# Patient Record
Sex: Male | Born: 1955 | Race: Black or African American | Hispanic: No | Smoking: Current every day smoker
Health system: Southern US, Community
[De-identification: ages and names within clinical notes are randomized; demographics above are authoritative.]

## PROBLEM LIST (undated history)

## (undated) DIAGNOSIS — F209 Schizophrenia, unspecified: Secondary | ICD-10-CM

## (undated) DIAGNOSIS — I10 Essential (primary) hypertension: Secondary | ICD-10-CM

## (undated) DIAGNOSIS — G20A1 Parkinson's disease without dyskinesia, without mention of fluctuations: Secondary | ICD-10-CM

## (undated) DIAGNOSIS — G20 Parkinson's disease: Secondary | ICD-10-CM

## (undated) DIAGNOSIS — E119 Type 2 diabetes mellitus without complications: Secondary | ICD-10-CM

---

## 2018-07-11 ENCOUNTER — Encounter: Payer: Self-pay | Admitting: Emergency Medicine

## 2018-07-11 ENCOUNTER — Inpatient Hospital Stay
Admission: EM | Admit: 2018-07-11 | Discharge: 2018-07-15 | DRG: 684 | Disposition: A | Discharge status: Home / Self Care | Attending: Internal Medicine | Admitting: Internal Medicine

## 2018-07-11 ENCOUNTER — Other Ambulatory Visit: Payer: Self-pay

## 2018-07-11 ENCOUNTER — Emergency Department

## 2018-07-11 DIAGNOSIS — N400 Enlarged prostate without lower urinary tract symptoms: Secondary | ICD-10-CM | POA: Diagnosis present

## 2018-07-11 DIAGNOSIS — Z794 Long term (current) use of insulin: Secondary | ICD-10-CM | POA: Diagnosis not present

## 2018-07-11 DIAGNOSIS — N179 Acute kidney failure, unspecified: Secondary | ICD-10-CM | POA: Diagnosis present

## 2018-07-11 DIAGNOSIS — N320 Bladder-neck obstruction: Secondary | ICD-10-CM | POA: Diagnosis present

## 2018-07-11 DIAGNOSIS — G20 Parkinson's disease: Secondary | ICD-10-CM | POA: Diagnosis present

## 2018-07-11 DIAGNOSIS — I129 Hypertensive chronic kidney disease with stage 1 through stage 4 chronic kidney disease, or unspecified chronic kidney disease: Secondary | ICD-10-CM | POA: Diagnosis present

## 2018-07-11 DIAGNOSIS — F209 Schizophrenia, unspecified: Secondary | ICD-10-CM | POA: Diagnosis present

## 2018-07-11 DIAGNOSIS — N189 Chronic kidney disease, unspecified: Secondary | ICD-10-CM | POA: Diagnosis present

## 2018-07-11 DIAGNOSIS — E1122 Type 2 diabetes mellitus with diabetic chronic kidney disease: Secondary | ICD-10-CM | POA: Diagnosis present

## 2018-07-11 DIAGNOSIS — N136 Pyonephrosis: Secondary | ICD-10-CM | POA: Diagnosis present

## 2018-07-11 DIAGNOSIS — R339 Retention of urine, unspecified: Secondary | ICD-10-CM

## 2018-07-11 DIAGNOSIS — D72829 Elevated white blood cell count, unspecified: Secondary | ICD-10-CM

## 2018-07-11 HISTORY — DX: Type 2 diabetes mellitus without complications: E11.9

## 2018-07-11 HISTORY — DX: Parkinson's disease without dyskinesia, without mention of fluctuations: G20.A1

## 2018-07-11 HISTORY — DX: Parkinson's disease: G20

## 2018-07-11 HISTORY — DX: Schizophrenia, unspecified: F20.9

## 2018-07-11 HISTORY — DX: Essential (primary) hypertension: I10

## 2018-07-11 LAB — URINALYSIS, COMPLETE (UACMP) WITH MICROSCOPIC
Bacteria, UA: NONE SEEN
Bilirubin Urine: NEGATIVE
Glucose, UA: NEGATIVE mg/dL
Ketones, ur: NEGATIVE mg/dL
Leukocytes,Ua: NEGATIVE
Nitrite: NEGATIVE
Protein, ur: NEGATIVE mg/dL
Specific Gravity, Urine: 1.008 (ref 1.005–1.030)
Squamous Epithelial / LPF: NONE SEEN (ref 0–5)
pH: 5 (ref 5.0–8.0)

## 2018-07-11 LAB — COMPREHENSIVE METABOLIC PANEL
ALT: 16 U/L (ref 0–44)
AST: 14 U/L — ABNORMAL LOW (ref 15–41)
Albumin: 3.3 g/dL — ABNORMAL LOW (ref 3.5–5.0)
Alkaline Phosphatase: 66 U/L (ref 38–126)
Anion gap: 9 (ref 5–15)
BUN: 80 mg/dL — ABNORMAL HIGH (ref 8–23)
CO2: 27 mmol/L (ref 22–32)
Calcium: 8.5 mg/dL — ABNORMAL LOW (ref 8.9–10.3)
Chloride: 109 mmol/L (ref 98–111)
Creatinine, Ser: 5.27 mg/dL — ABNORMAL HIGH (ref 0.61–1.24)
GFR calc Af Amer: 12 mL/min — ABNORMAL LOW (ref >60)
GFR calc non Af Amer: 11 mL/min — ABNORMAL LOW (ref >60)
Glucose, Bld: 88 mg/dL (ref 70–99)
Potassium: 4.5 mmol/L (ref 3.5–5.1)
Sodium: 145 mmol/L (ref 135–145)
Total Bilirubin: 0.5 mg/dL (ref 0.3–1.2)
Total Protein: 7.6 g/dL (ref 6.5–8.1)

## 2018-07-11 LAB — CBC WITH DIFFERENTIAL/PLATELET
Abs Immature Granulocytes: 0.07 10*3/uL (ref 0.00–0.07)
Basophils Absolute: 0 10*3/uL (ref 0.0–0.1)
Basophils Relative: 0 %
Eosinophils Absolute: 0.1 10*3/uL (ref 0.0–0.5)
Eosinophils Relative: 1 %
HCT: 36.5 % — ABNORMAL LOW (ref 39.0–52.0)
Hemoglobin: 11.3 g/dL — ABNORMAL LOW (ref 13.0–17.0)
Immature Granulocytes: 1 %
Lymphocytes Relative: 17 %
Lymphs Abs: 2.3 10*3/uL (ref 0.7–4.0)
MCH: 26.4 pg (ref 26.0–34.0)
MCHC: 31 g/dL (ref 30.0–36.0)
MCV: 85.3 fL (ref 80.0–100.0)
Monocytes Absolute: 1.3 10*3/uL — ABNORMAL HIGH (ref 0.1–1.0)
Monocytes Relative: 10 %
Neutro Abs: 9.5 10*3/uL — ABNORMAL HIGH (ref 1.7–7.7)
Neutrophils Relative %: 71 %
Platelets: 429 10*3/uL — ABNORMAL HIGH (ref 150–400)
RBC: 4.28 MIL/uL (ref 4.22–5.81)
RDW: 15.7 % — ABNORMAL HIGH (ref 11.5–15.5)
WBC: 13.2 10*3/uL — ABNORMAL HIGH (ref 4.0–10.5)
nRBC: 0 % (ref 0.0–0.2)

## 2018-07-11 LAB — MRSA PCR SCREENING: MRSA by PCR: NEGATIVE

## 2018-07-11 LAB — SODIUM, URINE, RANDOM: Sodium, Ur: 65 mmol/L

## 2018-07-11 LAB — CREATININE, URINE, RANDOM: Creatinine, Urine: 72 mg/dL

## 2018-07-11 LAB — GLUCOSE, CAPILLARY: Glucose-Capillary: 148 mg/dL — ABNORMAL HIGH (ref 70–99)

## 2018-07-11 LAB — HEMOGLOBIN A1C
Hgb A1c MFr Bld: 6.4 % — ABNORMAL HIGH (ref 4.8–5.6)
Mean Plasma Glucose: 136.98 mg/dL

## 2018-07-11 LAB — OSMOLALITY, URINE: Osmolality, Ur: 334 mOsm/kg (ref 300–900)

## 2018-07-11 MED ORDER — HYDRALAZINE HCL 20 MG/ML IJ SOLN: 10.0000 mg | Freq: Four times a day (QID) | INTRAMUSCULAR | Status: DC | PRN

## 2018-07-11 MED ORDER — ONDANSETRON HCL 4 MG/2ML IJ SOLN
4.0000 mg | Freq: Four times a day (QID) | INTRAMUSCULAR | Status: DC | PRN
  Administered 2018-07-12: 4 mg via INTRAVENOUS
  Filled 2018-07-11: qty 2

## 2018-07-11 MED ORDER — ACETAMINOPHEN 325 MG PO TABS
650.0000 mg | ORAL_TABLET | Freq: Four times a day (QID) | ORAL | Status: DC | PRN
  Administered 2018-07-12 – 2018-07-13 (×4): 650 mg via ORAL
  Filled 2018-07-11 (×4): qty 2

## 2018-07-11 MED ORDER — OLANZAPINE 5 MG PO TABS
15.0000 mg | ORAL_TABLET | Freq: Every day | ORAL | Status: DC
  Administered 2018-07-11 – 2018-07-14 (×4): 15 mg via ORAL
  Filled 2018-07-11 (×5): qty 1

## 2018-07-11 MED ORDER — INSULIN ASPART 100 UNIT/ML ~~LOC~~ SOLN
0.0000 [IU] | Freq: Three times a day (TID) | SUBCUTANEOUS | Status: DC
  Administered 2018-07-12 – 2018-07-13 (×2): 2 [IU] via SUBCUTANEOUS
  Administered 2018-07-14: 1 [IU] via SUBCUTANEOUS
  Administered 2018-07-14: 2 [IU] via SUBCUTANEOUS
  Administered 2018-07-15: 1 [IU] via SUBCUTANEOUS
  Filled 2018-07-11 (×5): qty 1

## 2018-07-11 MED ORDER — ALBUTEROL SULFATE (2.5 MG/3ML) 0.083% IN NEBU: 2.5000 mg | INHALATION_SOLUTION | RESPIRATORY_TRACT | Status: DC | PRN

## 2018-07-11 MED ORDER — ONDANSETRON HCL 4 MG PO TABS: 4.0000 mg | ORAL_TABLET | Freq: Four times a day (QID) | ORAL | Status: DC | PRN

## 2018-07-11 MED ORDER — POLYETHYLENE GLYCOL 3350 17 G PO PACK: 17.0000 g | PACK | Freq: Every day | ORAL | Status: DC | PRN

## 2018-07-11 MED ORDER — INSULIN DETEMIR 100 UNIT/ML ~~LOC~~ SOLN
14.0000 [IU] | Freq: Two times a day (BID) | SUBCUTANEOUS | Status: DC
  Administered 2018-07-11 – 2018-07-15 (×8): 14 [IU] via SUBCUTANEOUS
  Filled 2018-07-11 (×9): qty 0.14

## 2018-07-11 MED ORDER — AMLODIPINE BESYLATE 10 MG PO TABS
10.0000 mg | ORAL_TABLET | Freq: Every day | ORAL | Status: DC
  Administered 2018-07-11 – 2018-07-14 (×3): 10 mg via ORAL
  Filled 2018-07-11 (×4): qty 1

## 2018-07-11 MED ORDER — ACETAMINOPHEN 650 MG RE SUPP: 650.0000 mg | Freq: Four times a day (QID) | RECTAL | Status: DC | PRN

## 2018-07-11 MED ORDER — HEPARIN SODIUM (PORCINE) 5000 UNIT/ML IJ SOLN
5000.0000 [IU] | Freq: Three times a day (TID) | INTRAMUSCULAR | Status: DC
  Administered 2018-07-11 – 2018-07-15 (×11): 5000 [IU] via SUBCUTANEOUS
  Filled 2018-07-11 (×11): qty 1

## 2018-07-11 MED ORDER — SODIUM CHLORIDE 0.45 % IV SOLN
INTRAVENOUS | Status: DC
  Administered 2018-07-11 – 2018-07-12 (×2): via INTRAVENOUS

## 2018-07-11 MED ORDER — TAMSULOSIN HCL 0.4 MG PO CAPS
0.4000 mg | ORAL_CAPSULE | Freq: Every day | ORAL | Status: DC
  Administered 2018-07-11 – 2018-07-14 (×4): 0.4 mg via ORAL
  Filled 2018-07-11 (×4): qty 1

## 2018-07-11 MED ORDER — HYDRALAZINE HCL 50 MG PO TABS
50.0000 mg | ORAL_TABLET | Freq: Three times a day (TID) | ORAL | Status: DC
  Administered 2018-07-11 – 2018-07-15 (×9): 50 mg via ORAL
  Filled 2018-07-11 (×10): qty 1

## 2018-07-11 NOTE — ED Notes (Signed)
Ultrasound at bedside

## 2018-07-11 NOTE — ED Notes (Signed)
Per nurse at prison, Pt has been self catheterizing due to urinary retention. PT recently finished antibiotic treatment for a UTI but lab work revealed BUN 89 and Creatinine 7.68.

## 2018-07-11 NOTE — ED Notes (Signed)
ED TO INPATIENT HANDOFF REPORT  ED Nurse Name and Phone #: Cecely Rengel [phone]  S Name/Age/Gender Andrew Hutchinson 63 y.o. male Room/Bed: ED24A/ED24A  Code Status   Code Status: Full Code  Home/SNF/Other jail Patient oriented to: self, place, time and situation Is this baseline? Yes   Triage Complete: Triage complete  Chief Complaint Urinary Retention/Abnormal Labs  Triage Note Pt in via [facility], in custody and in cuffs upon arrival.  Per paper work, pt sent for Nephrology consult due to urinary retention, having to self cath x approximately 10 days now.  Vitals WDL, NAD noted at this time.   Allergies No Known Allergies  Level of Care/Admitting Diagnosis ED Disposition    ED Disposition Condition Comment   Admit  Hospital Area: [facility] [[id number]]  Level of Care: Med-Surg [16]  Covid Evaluation: N/A  Diagnosis: AKI (acute kidney injury) [facility]) [[id number]]  Admitting Physician: Milagros Loll [[id number]]  Attending Physician: Milagros Loll [[id number]]  Estimated length of stay: past midnight tomorrow  Certification:: I certify this patient will need inpatient services for at least 2 midnights  PT Class (Do Not Modify): Inpatient [101]  PT Acc Code (Do Not Modify): Private [1]       B Medical/Surgery History Past Medical History:  Diagnosis Date  . Diabetes mellitus without complication (HCC)   . Hypertension   . Parkinson disease (HCC)   . Schizophrenia (HCC)    History reviewed. No pertinent surgical history.   A IV Location/Drains/Wounds Patient Lines/Drains/Airways Status   Active Line/Drains/Airways    Name:   Placement date:   Placement time:   Site:   Days:   Peripheral IV 07/11/18 Right Antecubital   07/11/18    1543    Antecubital   less than 1   Urethral Catheter jf Latex 16 Fr.   07/11/18    1554    Latex   less than 1          Intake/Output Last 24 hours No intake or output data in the 24 hours ending 07/11/18  1709  Labs/Imaging Results for orders placed or performed during the hospital encounter of 07/11/18 (from the past 48 hour(s))  Urinalysis, Complete w Microscopic     Status: Abnormal   Collection Time: 07/11/18  3:41 PM  Result Value Ref Range   Color, Urine STRAW (A) YELLOW   APPearance CLEAR (A) CLEAR   Specific Gravity, Urine 1.008 1.005 - 1.030   pH 5.0 5.0 - 8.0   Glucose, UA NEGATIVE NEGATIVE mg/dL   Hgb urine dipstick MODERATE (A) NEGATIVE   Bilirubin Urine NEGATIVE NEGATIVE   Ketones, ur NEGATIVE NEGATIVE mg/dL   Protein, ur NEGATIVE NEGATIVE mg/dL   Nitrite NEGATIVE NEGATIVE   Leukocytes,Ua NEGATIVE NEGATIVE   RBC / HPF 6-10 0 - 5 RBC/hpf   WBC, UA 0-5 0 - 5 WBC/hpf   Bacteria, UA NONE SEEN NONE SEEN   Squamous Epithelial / LPF NONE SEEN 0 - 5   Mucus PRESENT     Comment: Performed at [facility], [address] [place], [place] [address]  CBC with Differential/Platelet     Status: Abnormal   Collection Time: 07/11/18  3:42 PM  Result Value Ref Range   WBC 13.2 (H) 4.0 - 10.5 K/uL   RBC 4.28 4.22 - 5.81 MIL/uL   Hemoglobin 11.3 (L) 13.0 - 17.0 g/dL   HCT 58.0 (L) 99.8 - 33.8 %   MCV 85.3 80.0 - 100.0 fL  MCH 26.4 26.0 - 34.0 pg   MCHC 31.0 30.0 - 36.0 g/dL   RDW 95.6 (H) 21.3 - 08.6 %   Platelets 429 (H) 150 - 400 K/uL   nRBC 0.0 0.0 - 0.2 %   Neutrophils Relative % 71 %   Neutro Abs 9.5 (H) 1.7 - 7.7 K/uL   Lymphocytes Relative 17 %   Lymphs Abs 2.3 0.7 - 4.0 K/uL   Monocytes Relative 10 %   Monocytes Absolute 1.3 (H) 0.1 - 1.0 K/uL   Eosinophils Relative 1 %   Eosinophils Absolute 0.1 0.0 - 0.5 K/uL   Basophils Relative 0 %   Basophils Absolute 0.0 0.0 - 0.1 K/uL   Immature Granulocytes 1 %   Abs Immature Granulocytes 0.07 0.00 - 0.07 K/uL    Comment: Performed at [facility], [address] [place], [place] [address]  Comprehensive metabolic panel     Status: Abnormal   Collection Time: 07/11/18  3:42 PM  Result Value Ref Range    Sodium 145 135 - 145 mmol/L   Potassium 4.5 3.5 - 5.1 mmol/L   Chloride 109 98 - 111 mmol/L   CO2 27 22 - 32 mmol/L   Glucose, Bld 88 70 - 99 mg/dL   BUN 80 (H) 8 - 23 mg/dL   Creatinine, Ser 9.62 (H) 0.61 - 1.24 mg/dL   Calcium 8.5 (L) 8.9 - 10.3 mg/dL   Total Protein 7.6 6.5 - 8.1 g/dL   Albumin 3.3 (L) 3.5 - 5.0 g/dL   AST 14 (L) 15 - 41 U/L   ALT 16 0 - 44 U/L   Alkaline Phosphatase 66 38 - 126 U/L   Total Bilirubin 0.5 0.3 - 1.2 mg/dL   GFR calc non Af Amer 11 (L) >60 mL/min   GFR calc Af Amer 12 (L) >60 mL/min   Anion gap 9 5 - 15    Comment: Performed at [facility], [address], [place], [place] [address]   US Renal  Result Date: 07/11/2018 CLINICAL DATA:  Acute kidney injury EXAM: RENAL / URINARY TRACT ULTRASOUND COMPLETE COMPARISON:  None. FINDINGS: Right Kidney: Renal measurements: 9.6 x 4.8 x 4.5 cm = volume: 108.7 mL . Echogenicity and renal cortical thickness are within normal limits. No mass or perinephric fluid visualized. There is slight fullness of the right renal collecting system. No sonographically demonstrable calculus or ureterectasis. Left Kidney: Renal measurements: 10.3 x 5.0 x 5.3 cm = volume: 143.5 mL. Echogenicity and renal cortical thickness are within normal limits. No mass or perinephric fluid visualized. There is fullness of the left renal collecting system, slightly more than on the right side. No sonographically demonstrable calculus or ureterectasis. Bladder: Urinary bladder is decompressed. There appears to be thickening of the wall of the urinary bladder. IMPRESSION: 1. Mild hydronephrosis on the left. Rather minimal hydronephrosis on the right. No obstructing focus evident on either side. The renal cortical thickness and renal echogenicity are normal bilaterally. 2. Urinary bladder is largely decompressed. There appears to be a degree of wall thickening in the bladder which may indicate chronic cystitis. Electronically Signed   By: Bretta Bang III M.D.   On: 07/11/2018 16:36    Pending Labs Unresulted Labs (From admission, onward)    Start     Ordered   07/12/18 0500  Basic metabolic panel  Tomorrow morning,   STAT     07/11/18 1659   07/12/18 0500  CBC  Tomorrow morning,   STAT  07/11/18 1659   07/11/18 1659  HIV antibody (Routine Testing)  Add-on,   AD     07/11/18 1659   07/11/18 1658  Hemoglobin A1c  Add-on,   AD    Comments:  To assess prior glycemic control    07/11/18 1659   07/11/18 1645  Creatinine, urine, random  Add-on,   AD     07/11/18 1645   07/11/18 1645  Microalbumin / creatinine urine ratio  Add-on,   AD     07/11/18 1645   07/11/18 1645  Osmolality, urine  Add-on,   AD     07/11/18 1645   07/11/18 1645  Sodium, urine, random  Add-on,   AD     07/11/18 1645          Vitals/Pain Today's Vitals   07/11/18 1544 07/11/18 1600 07/11/18 1615 07/11/18 1630  BP:  (!) 171/96  (!) 177/77  Pulse:  86 85 80  Resp:  (!) 21 (!) 27 (!) 22  Temp:      TempSrc:      SpO2: 99% 99% 100% 100%  Weight:      Height:      PainSc:        Isolation Precautions No active isolations  Medications Medications  0.45 % sodium chloride infusion ( Intravenous New Bag/Given 07/11/18 1656)  insulin aspart (novoLOG) injection 0-9 Units (has no administration in time range)  heparin injection 5,000 Units (has no administration in time range)  acetaminophen (TYLENOL) tablet 650 mg (has no administration in time range)    Or  acetaminophen (TYLENOL) suppository 650 mg (has no administration in time range)  polyethylene glycol (MIRALAX / GLYCOLAX) packet 17 g (has no administration in time range)  ondansetron (ZOFRAN) tablet 4 mg (has no administration in time range)    Or  ondansetron (ZOFRAN) injection 4 mg (has no administration in time range)  albuterol (PROVENTIL) (2.5 MG/3ML) 0.083% nebulizer solution 2.5 mg (has no administration in time range)  amLODipine (NORVASC) tablet 10 mg (has no administration  in time range)  OLANZapine (ZYPREXA) tablet 15 mg (has no administration in time range)  tamsulosin (FLOMAX) capsule 0.4 mg (has no administration in time range)    Mobility walks Low fall risk   Focused Assessments here for urinary retention and pain over bladder area. has been self cathing at jail. AKI. now has foley. in left wrist and right ankle shackle from jail. sheriff with   R Recommendations: See Admitting Provider Note  Report given to:   Additional Notes:

## 2018-07-11 NOTE — Progress Notes (Signed)
Advance care planning  Purpose of Encounter Acute kidney injury  Parties in Attendance Patient.  2 of his prison guards at bedside not involved in decision-making  Patients Decisional capacity Alert and oriented.  Able to make his own medical decisions  Discussed in detail regarding acute kidney injury.  Treatment plan , prognosis discussed.  All questions answered.  CODE STATUS is full code  Orders entered and CODE STATUS changed  FULL CODE  Time spent - 17 minutes

## 2018-07-11 NOTE — H&P (Signed)
[facility] [place] [facility]   PATIENT NAME: Andrew Hutchinson    MR#:  [id number]  DATE OF BIRTH:  1955-04-10  DATE OF ADMISSION:  07/11/2018  PRIMARY CARE PHYSICIAN: System, Pcp Not In   REQUESTING/REFERRING PHYSICIAN: Dr. Roxan Hockey  CHIEF COMPLAINT:   Chief Complaint  Patient presents with  . Urinary Retention    HISTORY OF PRESENT ILLNESS:  Andrew Hutchinson  is a 63 y.o. male with a known history of schizophrenia, Parkinson's, diabetes mellitus, hypertension presents to the emergency room from local prison due to high BUN and creatinine.  Patient unable to follow-up with nephrology.  He was found to have bladder outlet obstruction 10 days back and has been doing in and out catheterization multiple times a day at the present.  Was seen by prison physician and sent here for evaluation.  Here his BUN is elevated and creatinine elevated to 5.6.  Patient is being admitted for acute kidney injury.  He has had lower extremity edema for the past 2 weeks.  No shortness of breath.  Does have poor appetite and fatigue.   Reviewing his medication patient is continuing to take metformin for his diabetes.  Has been on Bactrim for a week and stop it on the 16th.  PAST MEDICAL HISTORY:   Past Medical History:  Diagnosis Date  . Diabetes mellitus without complication (HCC)   . Hypertension   . Parkinson disease (HCC)   . Schizophrenia (HCC)     PAST SURGICAL HISTORY:  History reviewed. No pertinent surgical history.  SOCIAL HISTORY:   Social History   Tobacco Use  . Smoking status: Not on file  Substance Use Topics  . Alcohol use: Not on file    FAMILY HISTORY:  No family history on file. Family's of diabetes present.  DRUG ALLERGIES:  No Known Allergies  REVIEW OF SYSTEMS:   Review of Systems  Constitutional: Positive for malaise/fatigue. Negative for chills and fever.  HENT: Negative for sore throat.   Eyes: Negative for blurred vision, double vision and pain.   Respiratory: Negative for cough, hemoptysis, shortness of breath and wheezing.   Cardiovascular: Negative for chest pain, palpitations, orthopnea and leg swelling.  Gastrointestinal: Negative for abdominal pain, constipation, diarrhea, heartburn, nausea and vomiting.  Genitourinary: Positive for frequency. Negative for dysuria and hematuria.  Musculoskeletal: Negative for back pain and joint pain.  Skin: Negative for rash.  Neurological: Negative for sensory change, speech change, focal weakness and headaches.  Endo/Heme/Allergies: Does not bruise/bleed easily.  Psychiatric/Behavioral: Negative for depression. The patient is not nervous/anxious.     MEDICATIONS AT HOME:   Prior to Admission medications   Not on File     VITAL SIGNS:  Blood pressure (!) 177/77, pulse 80, temperature 98.7 F (37.1 C), temperature source Oral, resp. rate (!) 22, height  (1.753 m), weight 108 kg, SpO2 100 %.  PHYSICAL EXAMINATION:  Physical Exam  GENERAL:  63 y.o.-year-old patient lying in the bed with no acute distress.  Obese EYES: Pupils equal, round, reactive to light and accommodation. No scleral icterus. Extraocular muscles intact.  HEENT: Head atraumatic, normocephalic. Oropharynx and nasopharynx clear. No oropharyngeal erythema, moist oral mucosa  NECK:  Supple, no jugular venous distention. No thyroid enlargement, no tenderness.  LUNGS: Normal breath sounds bilaterally, no wheezing, rales, rhonchi. No use of accessory muscles of respiration.  CARDIOVASCULAR: S1, S2 normal. No murmurs, rubs, or gallops.  ABDOMEN: Soft, nontender, nondistended. [facility]ent. No organomegaly or mass.  EXTREMITIES: No  cyanosis, or clubbing. + 2 pedal & radial pulses b/l.  Bilateral lower extremity edema NEUROLOGIC: Cranial nerves II through XII are intact. No focal Motor or sensory deficits appreciated b/l PSYCHIATRIC: The patient is alert and oriented x 3. Good affect.  SKIN: No obvious rash,  lesion, or ulcer.   LABORATORY PANEL:   CBC Recent Labs  Lab 07/11/18 1542  WBC 13.2*  HGB 11.3*  HCT 36.5*  PLT 429*   ------------------------------------------------------------------------------------------------------------------  Chemistries  Recent Labs  Lab 07/11/18 1542  NA 145  K 4.5  CL 109  CO2 27  GLUCOSE 88  BUN 80*  CREATININE 5.27*  CALCIUM 8.5*  AST 14*  ALT 16  ALKPHOS 66  BILITOT 0.5   ------------------------------------------------------------------------------------------------------------------  Cardiac Enzymes No results for input(s): TROPONINI in the last 168 hours. ------------------------------------------------------------------------------------------------------------------  RADIOLOGY:  US Renal  Result Date: 07/11/2018 CLINICAL DATA:  Acute kidney injury EXAM: RENAL / URINARY TRACT ULTRASOUND COMPLETE COMPARISON:  None. FINDINGS: Right Kidney: Renal measurements: 9.6 x 4.8 x 4.5 cm = volume: 108.7 mL . Echogenicity and renal cortical thickness are within normal limits. No mass or perinephric fluid visualized. There is slight fullness of the right renal collecting system. No sonographically demonstrable calculus or ureterectasis. Left Kidney: Renal measurements: 10.3 x 5.0 x 5.3 cm = volume: 143.5 mL. Echogenicity and renal cortical thickness are within normal limits. No mass or perinephric fluid visualized. There is fullness of the left renal collecting system, slightly more than on the right side. No sonographically demonstrable calculus or ureterectasis. Bladder: Urinary bladder is decompressed. There appears to be thickening of the wall of the urinary bladder. IMPRESSION: 1. Mild hydronephrosis on the left. Rather minimal hydronephrosis on the right. No obstructing focus evident on either side. The renal cortical thickness and renal echogenicity are normal bilaterally. 2. Urinary bladder is largely decompressed. There appears to be a degree  of wall thickening in the bladder which may indicate chronic cystitis. Electronically Signed   By: Bretta Bang III M.D.   On: 07/11/2018 16:36     IMPRESSION AND PLAN:   *Acute kidney injury over likely chronic kidney disease of diabetes mellitus.  Exacerbated by his bladder outlet obstruction.  Foley catheter placed.  Monitor input and output.  Will start IV fluids.  Discussed with Dr. Thedore Mins of nephrology.  Hold nephrotoxic medications.  *Hypertension.  Continue home medications of amlodipine.  Hydralazine IV PRN  *Bladder outlet obstruction.  Start Flomax.  Will need outpatient urology follow-up after discharge.  Will need Foley at discharge  *Diabetes mellitus.  Reduce dose of his NPH insulin from 22 to 14 units twice daily.  Sliding scale insulin added.  *Schizophrenia.  Continue home medications  DVT prophylaxis with heparin  All the records are reviewed and case discussed with ED provider. Management plans discussed with the patient, family and they are in agreement.  CODE STATUS: Full code  TOTAL TIME TAKING CARE OF THIS PATIENT: 40 minutes.   Molinda Bailiff Niaja Stickley M.D on 07/11/2018 at 5:02 PM  Between 7am to 6pm - Pager - [phone]  After 6pm go to [email or web address] - password [email or web address]  [facility] [place]  Office  [phone]  CC: Primary care physician; System, Pcp Not In  Note: This dictation was prepared with Dragon dictation along with smaller phrase technology. Any transcriptional errors that result from this process are unintentional.

## 2018-07-11 NOTE — ED Triage Notes (Signed)
Pt in via [facility], in custody and in cuffs upon arrival.  Per paper work, pt sent for Nephrology consult due to urinary retention, having to self cath x approximately 10 days now.  Vitals WDL, NAD noted at this time.

## 2018-07-11 NOTE — ED Provider Notes (Signed)
[facility] Emergency Department Provider Note    First MD Initiated Contact with Patient 07/11/18 1529     (approximate)  I have reviewed the triage vital signs and the nursing notes.   HISTORY  Chief Complaint Urinary Retention    HPI Andrew Hutchinson is a 63 y.o. male below listed past medical history presents the ER for abnormal labs and having urinary retention over the past several days.  He is presenting from prison.  Had blood work showing evidence of renal failure.  Reportedly also having worsening edema.  He does self cath.  Does feel like his abdomen is more distended.  Denies any history of kidney disorder.  He is not on dialysis.  No new medications.  No fevers.  Does have a history of enlarged prostate.   He denies any recent fevers.  No shortness of breath.  Does feel like he is becoming more distended and swollen.  Denies any abdominal pain.  Has not had any recent dysuria.   Past Medical History:  Diagnosis Date  . Diabetes mellitus without complication (HCC)   . Hypertension   . Parkinson disease (HCC)   . Schizophrenia (HCC)    No family history on file. History reviewed. No pertinent surgical history. There are no active problems to display for this patient.     Prior to Admission medications   Not on File    Allergies Patient has no known allergies.    Social History Social History   Tobacco Use  . Smoking status: Not on file  Substance Use Topics  . Alcohol use: Not on file  . Drug use: Not on file    Review of Systems Patient denies headaches, rhinorrhea, blurry vision, numbness, shortness of breath, chest pain, edema, cough, abdominal pain, nausea, vomiting, diarrhea, dysuria, fevers, rashes or hallucinations unless otherwise stated above in HPI. ____________________________________________   PHYSICAL EXAM:  VITAL SIGNS: Vitals:   07/11/18 1544 07/11/18 1600  BP:  (!) 171/96  Pulse:  86  Resp:  (!) 21  Temp:     SpO2: 99% 99%    Constitutional: Alert and oriented.  Eyes: Conjunctivae are normal.  Head: Atraumatic. Nose: No congestion/rhinnorhea. Mouth/Throat: Mucous membranes are moist.   Neck: No stridor. Painless ROM.  Cardiovascular: Normal rate, regular rhythm. Grossly normal heart sounds.  Good peripheral circulation. Respiratory: Normal respiratory effort.  No retractions. Lungs CTAB. Gastrointestinal: Soft and nontender. Non tender distention. No abdominal bruits. No CVA tenderness. Genitourinary: normal external gentialia Musculoskeletal: No lower extremity tenderness, 2+ edema.  No joint effusions. Neurologic:  Normal speech and language. No gross focal neurologic deficits are appreciated. No facial droop Skin:  Skin is warm, dry and intact. No rash noted. Psychiatric: Mood and affect are normal. Speech and behavior are normal.  ____________________________________________   LABS (all labs ordered are listed, but only abnormal results are displayed)  Results for orders placed or performed during the hospital encounter of 07/11/18 (from the past 24 hour(s))  Urinalysis, Complete w Microscopic     Status: Abnormal   Collection Time: 07/11/18  3:41 PM  Result Value Ref Range   Color, Urine STRAW (A) YELLOW   APPearance CLEAR (A) CLEAR   Specific Gravity, Urine 1.008 1.005 - 1.030   pH 5.0 5.0 - 8.0   Glucose, UA NEGATIVE NEGATIVE mg/dL   Hgb urine dipstick MODERATE (A) NEGATIVE   Bilirubin Urine NEGATIVE NEGATIVE   Ketones, ur NEGATIVE NEGATIVE mg/dL   Protein, ur NEGATIVE NEGATIVE  mg/dL   Nitrite NEGATIVE NEGATIVE   Leukocytes,Ua NEGATIVE NEGATIVE   RBC / HPF 6-10 0 - 5 RBC/hpf   WBC, UA 0-5 0 - 5 WBC/hpf   Bacteria, UA NONE SEEN NONE SEEN   Squamous Epithelial / LPF NONE SEEN 0 - 5   Mucus PRESENT   CBC with Differential/Platelet     Status: Abnormal   Collection Time: 07/11/18  3:42 PM  Result Value Ref Range   WBC 13.2 (H) 4.0 - 10.5 K/uL   RBC 4.28 4.22 - 5.81  MIL/uL   Hemoglobin 11.3 (L) 13.0 - 17.0 g/dL   HCT 16.1 (L) 09.6 - 04.5 %   MCV 85.3 80.0 - 100.0 fL   MCH 26.4 26.0 - 34.0 pg   MCHC 31.0 30.0 - 36.0 g/dL   RDW 40.9 (H) 81.1 - 91.4 %   Platelets 429 (H) 150 - 400 K/uL   nRBC 0.0 0.0 - 0.2 %   Neutrophils Relative % 71 %   Neutro Abs 9.5 (H) 1.7 - 7.7 K/uL   Lymphocytes Relative 17 %   Lymphs Abs 2.3 0.7 - 4.0 K/uL   Monocytes Relative 10 %   Monocytes Absolute 1.3 (H) 0.1 - 1.0 K/uL   Eosinophils Relative 1 %   Eosinophils Absolute 0.1 0.0 - 0.5 K/uL   Basophils Relative 0 %   Basophils Absolute 0.0 0.0 - 0.1 K/uL   Immature Granulocytes 1 %   Abs Immature Granulocytes 0.07 0.00 - 0.07 K/uL  Comprehensive metabolic panel     Status: Abnormal   Collection Time: 07/11/18  3:42 PM  Result Value Ref Range   Sodium 145 135 - 145 mmol/L   Potassium 4.5 3.5 - 5.1 mmol/L   Chloride 109 98 - 111 mmol/L   CO2 27 22 - 32 mmol/L   Glucose, Bld 88 70 - 99 mg/dL   BUN 80 (H) 8 - 23 mg/dL   Creatinine, Ser 7.82 (H) 0.61 - 1.24 mg/dL   Calcium 8.5 (L) 8.9 - 10.3 mg/dL   Total Protein 7.6 6.5 - 8.1 g/dL   Albumin 3.3 (L) 3.5 - 5.0 g/dL   AST 14 (L) 15 - 41 U/L   ALT 16 0 - 44 U/L   Alkaline Phosphatase 66 38 - 126 U/L   Total Bilirubin 0.5 0.3 - 1.2 mg/dL   GFR calc non Af Amer 11 (L) >60 mL/min   GFR calc Af Amer 12 (L) >60 mL/min   Anion gap 9 5 - 15   ____________________________________________  EKG My review and personal interpretation at Time: 15:33   Indication: edema  Rate: 80  Rhythm: sinus Axis: normal Other: normal intervals, no peaked t waves ____________________________________________  RADIOLOGY  I personally reviewed all radiographic images ordered to evaluate for the above acute complaints and reviewed radiology reports and findings.  These findings were personally discussed with the patient.  Please see medical record for radiology report.  ____________________________________________   PROCEDURES   Procedure(s) performed:  .Critical Care Performed by: Willy Eddy, MD Authorized by: Willy Eddy, MD   Critical care provider statement:    Critical care time (minutes):  35   Critical care time was exclusive of:  Separately billable procedures and treating other patients   Critical care was necessary to treat or prevent imminent or life-threatening deterioration of the following conditions:  Renal failure   Critical care was time spent personally by me on the following activities:  Development of treatment plan with patient or  surrogate, discussions with consultants, evaluation of patient's response to treatment, examination of patient, obtaining history from patient or surrogate, ordering and performing treatments and interventions, ordering and review of laboratory studies, ordering and review of radiographic studies, pulse oximetry, re-evaluation of patient's condition and review of old charts      Critical Care performed: yes ____________________________________________   INITIAL IMPRESSION / ASSESSMENT AND PLAN / ED COURSE  Pertinent labs & imaging results that were available during my care of the patient were reviewed by me and considered in my medical decision making (see chart for details).   DDX: KIA, renal failure, nephritis, nephrotic syndrome, CHF, anasarca, liver failure  Andrew Hutchinson is a 63 y.o. who presents to the ED with symptoms as described above.  Repeat blood work does show evidence of renal failure fortunately potassium normal.  Urinalysis without evidence of acute infection.  He is afebrile.  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  May be more chronic injury.  Foley catheter inserted and patient is making urine.  Patient will require admission the hospital for further medical management.     The patient was evaluated  in Emergency Department today for the symptoms described in the history of present illness. He/she was evaluated in the context of the global COVID-19 pandemic, which necessitated consideration that the patient might be at risk for infection with the SARS-CoV-2 virus that causes COVID-19. Institutional protocols and algorithms that pertain to the evaluation of patients at risk for COVID-19 are in a state of rapid change based on information released by regulatory bodies including the CDC and federal and state organizations. These policies and algorithms were followed during the patient's care in the ED.  As part of my medical decision making, I reviewed the following data within the electronic MEDICAL RECORD NUMBER Nursing notes reviewed and incorporated, Labs reviewed, notes from prior ED visits and [place] Controlled Substance Database   ____________________________________________   FINAL CLINICAL IMPRESSION(S) / ED DIAGNOSES  Final diagnoses:  Urinary retention  Acute renal failure, unspecified acute renal failure type (HCC)      NEW MEDICATIONS STARTED DURING THIS VISIT:  New Prescriptions   No medications on file     Note:  This document was prepared using Dragon voice recognition software and may include unintentional dictation errors.    Willy Eddy, MD 07/11/18 [phone]

## 2018-07-12 ENCOUNTER — Inpatient Hospital Stay

## 2018-07-12 LAB — BASIC METABOLIC PANEL
Anion gap: 7 (ref 5–15)
BUN: 59 mg/dL — ABNORMAL HIGH (ref 8–23)
CO2: 26 mmol/L (ref 22–32)
Calcium: 8.1 mg/dL — ABNORMAL LOW (ref 8.9–10.3)
Chloride: 112 mmol/L — ABNORMAL HIGH (ref 98–111)
Creatinine, Ser: 2.91 mg/dL — ABNORMAL HIGH (ref 0.61–1.24)
GFR calc Af Amer: 25 mL/min — ABNORMAL LOW (ref >60)
GFR calc non Af Amer: 22 mL/min — ABNORMAL LOW (ref >60)
Glucose, Bld: 108 mg/dL — ABNORMAL HIGH (ref 70–99)
Potassium: 3.9 mmol/L (ref 3.5–5.1)
Sodium: 145 mmol/L (ref 135–145)

## 2018-07-12 LAB — GLUCOSE, CAPILLARY
Glucose-Capillary: 112 mg/dL — ABNORMAL HIGH (ref 70–99)
Glucose-Capillary: 135 mg/dL — ABNORMAL HIGH (ref 70–99)
Glucose-Capillary: 173 mg/dL — ABNORMAL HIGH (ref 70–99)
Glucose-Capillary: 138 mg/dL — ABNORMAL HIGH (ref 70–99)

## 2018-07-12 LAB — CBC
HCT: 33.2 % — ABNORMAL LOW (ref 39.0–52.0)
Hemoglobin: 10.3 g/dL — ABNORMAL LOW (ref 13.0–17.0)
MCH: 26.5 pg (ref 26.0–34.0)
MCHC: 31 g/dL (ref 30.0–36.0)
MCV: 85.3 fL (ref 80.0–100.0)
Platelets: 412 10*3/uL — ABNORMAL HIGH (ref 150–400)
RBC: 3.89 MIL/uL — ABNORMAL LOW (ref 4.22–5.81)
RDW: 15.4 % (ref 11.5–15.5)
WBC: 12.8 10*3/uL — ABNORMAL HIGH (ref 4.0–10.5)
nRBC: 0 % (ref 0.0–0.2)

## 2018-07-12 LAB — MICROALBUMIN / CREATININE URINE RATIO
Creatinine, Urine: 57.7 mg/dL
Microalb Creat Ratio: 87 mg/g creat — ABNORMAL HIGH (ref 0–29)
Microalb, Ur: 50.1 ug/mL — ABNORMAL HIGH

## 2018-07-12 MED ORDER — SODIUM CHLORIDE 0.9 % IV BOLUS
500.0000 mL | Freq: Once | INTRAVENOUS | Status: AC
  Administered 2018-07-12: 500 mL via INTRAVENOUS

## 2018-07-12 NOTE — Progress Notes (Signed)
[facility]                                                                                                                                                                                  Patient Demographics   Andrew Hutchinson, is a 63 y.o. male, DOB - Jun 12, 1955, [id number]  Admit date - 07/11/2018   Admitting Physician Milagros LollSrikar Sudini, MD  Outpatient Primary MD for the patient is System, Pcp Not In   LOS - 1  Subjective: Patient states that he is feeling better    Review of Systems:   CONSTITUTIONAL: No documented fever. No fatigue, weakness. No weight gain, no weight loss.  EYES: No blurry or double vision.  ENT: No tinnitus. No postnasal drip. No redness of the oropharynx.  RESPIRATORY: No cough, no wheeze, no hemoptysis. No dyspnea.  CARDIOVASCULAR: No chest pain. No orthopnea. No palpitations. No syncope.  GASTROINTESTINAL: No nausea, no vomiting or diarrhea. No abdominal pain. No melena or hematochezia.  GENITOURINARY: No dysuria or hematuria.  ENDOCRINE: No polyuria or nocturia. No heat or cold intolerance.  HEMATOLOGY: No anemia. No bruising. No bleeding.  INTEGUMENTARY: No rashes. No lesions.  MUSCULOSKELETAL: No arthritis. No swelling. No gout.  NEUROLOGIC: No numbness, tingling, or ataxia. No seizure-type activity.  PSYCHIATRIC: No anxiety. No insomnia. No ADD.    Vitals:   Vitals:   07/12/18 0038 07/12/18 0318 07/12/18 0520 07/12/18 1020  BP: (!) 162/61 (!) 107/54 112/69 108/68  Pulse: (!) 119 (!) 108 (!) 104   Resp:  18    Temp: 98.9 F (37.2 C) 98.7 F (37.1 C)    TempSrc: Oral Oral    SpO2: 95% 96%    Weight:      Height:        Wt Readings from Last 3 Encounters:  07/11/18 108 kg     Intake/Output Summary (Last 24 hours) at 07/12/2018 1026 Last data filed at 07/12/2018 0900 Gross per 24 hour  Intake 1670.45 ml  Output 4200 ml  Net -2529.55 ml    Physical Exam:   GENERAL: Pleasant-appearing in no  apparent distress.  HEAD, EYES, EARS, NOSE AND THROAT: Atraumatic, normocephalic. Extraocular muscles are intact. Pupils equal and reactive to light. Sclerae anicteric. No conjunctival injection. No oro-pharyngeal erythema.  NECK: Supple. There is no jugular venous distention. No bruits, no lymphadenopathy, no thyromegaly.  HEART: Regular rate and rhythm,. No murmurs, no rubs, no clicks.  LUNGS: Clear to auscultation bilaterally. No rales or rhonchi. No wheezes.  ABDOMEN: Soft, flat, nontender, nondistended. Has good bowel sounds. No hepatosplenomegaly appreciated.  EXTREMITIES: No evidence of any cyanosis, clubbing, or 1+ peripheral edema.  +  2 pedal and radial pulses bilaterally.  NEUROLOGIC: The patient is alert, awake, and oriented x3 with no focal motor or sensory deficits appreciated bilaterally.  SKIN: Moist and warm with no rashes appreciated.  Psych: Not anxious, depressed LN: No inguinal LN enlargement    Antibiotics   Anti-infectives (From admission, onward)   None      Medications   Scheduled Meds: . amLODipine  10 mg Oral Daily  . heparin  5,000 Units Subcutaneous Q8H  . hydrALAZINE  50 mg Oral Q8H  . insulin aspart  0-9 Units Subcutaneous TID WC  . insulin detemir  14 Units Subcutaneous BID AC & HS  . OLANZapine  15 mg Oral QHS  . tamsulosin  0.4 mg Oral QPC supper   Continuous Infusions: . sodium chloride 75 mL/hr at 07/12/18 0526   PRN Meds:.acetaminophen **OR** acetaminophen, albuterol, hydrALAZINE, ondansetron **OR** ondansetron (ZOFRAN) IV, polyethylene glycol   Data Review:   Micro Results Recent Results (from the past 240 hour(s))  MRSA PCR Screening     Status: None   Collection Time: 07/11/18  6:41 PM  Result Value Ref Range Status   MRSA by PCR NEGATIVE NEGATIVE Final    Comment:        The GeneXpert MRSA Assay (FDA approved for NASAL specimens only), is one component of a comprehensive MRSA colonization surveillance program. It is  not intended to diagnose MRSA infection nor to guide or monitor treatment for MRSA infections. Performed at [facility], [address] [place], [place] [address]     Radiology Reports Dg Chest 1 View  Result Date: 07/12/2018 CLINICAL DATA:  Urinary retention, renal failure and leukocytosis. EXAM: CHEST  1 VIEW COMPARISON:  None. FINDINGS: The heart size and mediastinal contours are within normal limits. There is no evidence of pulmonary edema, consolidation, pneumothorax, nodule or pleural fluid. The visualized skeletal structures are unremarkable. IMPRESSION: No active disease. Electronically Signed   By: Irish Lack M.D.   On: 07/12/2018 07:45   US Renal  Result Date: 07/11/2018 CLINICAL DATA:  Acute kidney injury EXAM: RENAL / URINARY TRACT ULTRASOUND COMPLETE COMPARISON:  None. FINDINGS: Right Kidney: Renal measurements: 9.6 x 4.8 x 4.5 cm = volume: 108.7 mL . Echogenicity and renal cortical thickness are within normal limits. No mass or perinephric fluid visualized. There is slight fullness of the right renal collecting system. No sonographically demonstrable calculus or ureterectasis. Left Kidney: Renal measurements: 10.3 x 5.0 x 5.3 cm = volume: 143.5 mL. Echogenicity and renal cortical thickness are within normal limits. No mass or perinephric fluid visualized. There is fullness of the left renal collecting system, slightly more than on the right side. No sonographically demonstrable calculus or ureterectasis. Bladder: Urinary bladder is decompressed. There appears to be thickening of the wall of the urinary bladder. IMPRESSION: 1. Mild hydronephrosis on the left. Rather minimal hydronephrosis on the right. No obstructing focus evident on either side. The renal cortical thickness and renal echogenicity are normal bilaterally. 2. Urinary bladder is largely decompressed. There appears to be a degree of wall thickening in the bladder which may indicate chronic cystitis.  Electronically Signed   By: Bretta Bang III M.D.   On: 07/11/2018 16:36     CBC Recent Labs  Lab 07/11/18 1542 07/12/18 0508  WBC 13.2* 12.8*  HGB 11.3* 10.3*  HCT 36.5* 33.2*  PLT 429* 412*  MCV 85.3 85.3  MCH 26.4 26.5  MCHC 31.0 31.0  RDW 15.7* 15.4  LYMPHSABS 2.3  --  MONOABS 1.3*  --   EOSABS 0.1  --   BASOSABS 0.0  --     Chemistries  Recent Labs  Lab 07/11/18 1542 07/12/18 0508  NA 145 145  K 4.5 3.9  CL 109 112*  CO2 27 26  GLUCOSE 88 108*  BUN 80* 59*  CREATININE 5.27* 2.91*  CALCIUM 8.5* 8.1*  AST 14*  --   ALT 16  --   ALKPHOS 66  --   BILITOT 0.5  --    ------------------------------------------------------------------------------------------------------------------ estimated creatinine clearance is 31.5 mL/min (A) (by C-G formula based on SCr of 2.91 mg/dL (H)). ------------------------------------------------------------------------------------------------------------------ Recent Labs    07/11/18 1541  HGBA1C 6.4*   ------------------------------------------------------------------------------------------------------------------ No results for input(s): CHOL, HDL, LDLCALC, TRIG, CHOLHDL, LDLDIRECT in the last 72 hours. ------------------------------------------------------------------------------------------------------------------ No results for input(s): TSH, T4TOTAL, T3FREE, THYROIDAB in the last 72 hours.  Invalid input(s): FREET3 ------------------------------------------------------------------------------------------------------------------ No results for input(s): VITAMINB12, FOLATE, FERRITIN, TIBC, IRON, RETICCTPCT in the last 72 hours.  Coagulation profile No results for input(s): INR, PROTIME in the last 168 hours.  No results for input(s): DDIMER in the last 72 hours.  Cardiac Enzymes No results for input(s): CKMB, TROPONINI, MYOGLOBIN in the last 168 hours.  Invalid input(s):  CK ------------------------------------------------------------------------------------------------------------------ Invalid input(s): POCBNP    Assessment & Plan   *Acute kidney injury over likely chronic kidney disease of diabetes mellitus.   Improved with IV fluids Continue Foley Repeat BMP in the morning Nephrology evaluation pending   *Hypertension.  Continue home medications of amlodipine.  Hydralazine IV PRN blood pressure stable  *Bladder outlet obstruction.    Continue  Flomax.  Will need outpatient urology follow-up after discharge.  Will need Foley at discharge  *Diabetes mellitus.  Blood sugar currently stable Continue current Lantus dose  *Schizophrenia.  Continue home medications  *Leukocytosis likely reactive    Code Status Orders  (From admission, onward)         Start     Ordered   07/11/18 1658  Full code  Continuous     07/11/18 1659        Code Status History    This patient has a current code status but no historical code status.           Consults nephrology  DVT Prophylaxis   Heparin  Lab Results  Component Value Date   PLT 412 (H) 07/12/2018     Time Spent in minutes   35 minutes  Greater than 50% of time spent in care coordination and counseling patient regarding the condition and plan of care.   Auburn Bilberry M.D on 07/12/2018 at 10:26 AM  Between 7am to 6pm - Pager - [phone]  After 6pm go to [email or web address] - Social research officer, government  [employer]   Office  [phone]

## 2018-07-12 NOTE — Consult Note (Signed)
Date: 07/12/2018                  Patient Name:  Andrew Hutchinson  MRN: [id number]  DOB: May 28, 1955  Age / Sex: 63 y.o., male         PCP: System, Pcp Not In                 Service Requesting Consult: IM/ Auburn Bilberry, MD                 Reason for Consult: ARF            History of Present Illness: Patient is a 63 y.o. male with medical problems of Parkinsons, Schizophrenia, HTN, insulin-dependent diabetic who was admitted to [facility] on 07/11/2018 for evaluation of urinary retention.  No baseline Creatinine available Patient states that he was brought to the hospital because he was unable to void and was requiring self-catheterization.  He has not had this kind of problem in the past.  He denies having any prostate problems in the past.  In the ER, noted to have critically elevated creatinine.  Renal ultrasound showed mild hydronephrosis on the left.  Urinary bladder was decompressed and bladder wall thickening was noted After Foley catheter was placed, creatinine is down to 2.91 from 5.27   Medications: Outpatient medications: No medications prior to admission.    Current medications: Current Facility-Administered Medications  Medication Dose Route Frequency Provider Last Rate Last Dose  . 0.45 % sodium chloride infusion   Intravenous Continuous Milagros Loll, MD 75 mL/hr at 07/12/18 0526    . acetaminophen (TYLENOL) tablet 650 mg  650 mg Oral Q6H PRN Milagros Loll, MD   650 mg at 07/12/18 0221   Or  . acetaminophen (TYLENOL) suppository 650 mg  650 mg Rectal Q6H PRN Sudini, Wardell Heath, MD      . albuterol (PROVENTIL) (2.5 MG/3ML) 0.083% nebulizer solution 2.5 mg  2.5 mg Nebulization Q2H PRN Sudini, Srikar, MD      . amLODipine (NORVASC) tablet 10 mg  10 mg Oral Daily Sudini, Srikar, MD   10 mg at 07/11/18 1842  . heparin injection 5,000 Units  5,000 Units Subcutaneous Q8H Sudini, Wardell Heath, MD   5,000 Units at 07/12/18 0522  . hydrALAZINE (APRESOLINE) injection 10 mg  10 mg Intravenous  Q6H PRN Sudini, Wardell Heath, MD      . hydrALAZINE (APRESOLINE) tablet 50 mg  50 mg Oral Q8H Sudini, Srikar, MD   50 mg at 07/11/18 2132  . insulin aspart (novoLOG) injection 0-9 Units  0-9 Units Subcutaneous TID WC Sudini, Srikar, MD      . insulin detemir (LEVEMIR) injection 14 Units  14 Units Subcutaneous BID AC & HS Milagros Loll, MD   14 Units at 07/12/18 0856  . OLANZapine (ZYPREXA) tablet 15 mg  15 mg Oral QHS Milagros Loll, MD   15 mg at 07/11/18 2133  . ondansetron (ZOFRAN) tablet 4 mg  4 mg Oral Q6H PRN Milagros Loll, MD       Or  . ondansetron (ZOFRAN) injection 4 mg  4 mg Intravenous Q6H PRN Sudini, Srikar, MD      . polyethylene glycol (MIRALAX / GLYCOLAX) packet 17 g  17 g Oral Daily PRN Sudini, Wardell Heath, MD      . tamsulosin (FLOMAX) capsule 0.4 mg  0.4 mg Oral QPC supper Milagros Loll, MD   0.4 mg at 07/11/18 1842      Allergies: No Known Allergies    Past  Medical History: Past Medical History:  Diagnosis Date  . Diabetes mellitus without complication (HCC)   . Hypertension   . Parkinson disease (HCC)   . Schizophrenia [facility])      Past Surgical History: History reviewed. No pertinent surgical history.   Family History: History reviewed. No pertinent family history.   Social History: Social History   Socioeconomic History  . Marital status: Unknown    Spouse name: Not on file  . Number of children: Not on file  . Years of education: Not on file  . Highest education level: Not on file  Occupational History  . Not on file  Social Needs  . Financial resource strain: Not on file  . Food insecurity:    Worry: Not on file    Inability: Not on file  . Transportation needs:    Medical: Not on file    Non-medical: Not on file  Tobacco Use  . Smoking status: Current Every Day Smoker  . Smokeless tobacco: Never Used  Substance and Sexual Activity  . Alcohol use: Yes    Comment: none currently due to being incarcerated  . Drug use: Not Currently  . Sexual  activity: Not on file  Lifestyle  . Physical activity:    Days per week: Not on file    Minutes per session: Not on file  . Stress: Not on file  Relationships  . Social connections:    Talks on phone: Not on file    Gets together: Not on file    Attends religious service: Not on file    Active member of club or organization: Not on file    Attends meetings of clubs or organizations: Not on file    Relationship status: Not on file  . Intimate partner violence:    Fear of current or ex partner: Not on file    Emotionally abused: Not on file    Physically abused: Not on file    Forced sexual activity: Not on file  Other Topics Concern  . Not on file  Social History Narrative   Pt from prison     Review of Systems: Gen: Denies any fevers or chills HEENT: No vision or hearing problems CV: No chest pain or shortness of breath Resp: No cough or sputum production GI: No nausea, vomiting or diarrhea.  No blood in the stool GU : Problems with urinary retention as described above.  No hematuria MS: Ambulatory.  Denies any acute joint pain or swelling Derm:  No complaints Psych: No complaints Heme: No complaints Neuro: No complaints Endocrine: No complaints  Vital Signs: Blood pressure 108/68, pulse (!) 104, temperature 98.7 F (37.1 C), temperature source Oral, resp. rate 18, height 5\' 9"  (1.753 m), weight 108 kg, SpO2 96 %.   Intake/Output Summary (Last 24 hours) at 07/12/2018 1055 Last data filed at 07/12/2018 0900 Gross per 24 hour  Intake 1670.45 ml  Output 4200 ml  Net -2529.55 ml    Weight trends: [employer]   07/11/18 1515  Weight: 108 kg    Physical Exam: General:  Laying in the bed, no acute distress  HEENT  anicteric, moist oral mucous membranes  Neck:  Supple, no masses  Lungs:  Normal breathing effort, clear to auscultation  Heart::  Regular rhythm, no rub or gallop  Abdomen:  Soft, nondistended, obese, nontender  Extremities:  1-2+ edema   Neurologic:  Alert, able to answer questions appropriately  Skin:  No acute rashes  Foley:  In  place       Lab results: Basic Metabolic Panel: Recent Labs  Lab 07/11/18 1542 07/12/18 0508  NA 145 145  K 4.5 3.9  CL 109 112*  CO2 27 26  GLUCOSE 88 108*  BUN 80* 59*  CREATININE 5.27* 2.91*  CALCIUM 8.5* 8.1*    Liver Function Tests: Recent Labs  Lab 07/11/18 1542  AST 14*  ALT 16  ALKPHOS 66  BILITOT 0.5  PROT 7.6  ALBUMIN 3.3*   No results for input(s): LIPASE, AMYLASE in the last 168 hours. No results for input(s): AMMONIA in the last 168 hours.  CBC: Recent Labs  Lab 07/11/18 1542 07/12/18 0508  WBC 13.2* 12.8*  NEUTROABS 9.5*  --   HGB 11.3* 10.3*  HCT 36.5* 33.2*  MCV 85.3 85.3  PLT 429* 412*    Cardiac Enzymes: No results for input(s): CKTOTAL, TROPONINI in the last 168 hours.  BNP: Invalid input(s): POCBNP  CBG: Recent Labs  Lab 07/11/18 2058 07/12/18 0731  GLUCAP 148* 112*    Microbiology: Recent Results (from the past 720 hour(s))  MRSA PCR Screening     Status: None   Collection Time: 07/11/18  6:41 PM  Result Value Ref Range Status   MRSA by PCR NEGATIVE NEGATIVE Final    Comment:        The GeneXpert MRSA Assay (FDA approved for NASAL specimens only), is one component of a comprehensive MRSA colonization surveillance program. It is not intended to diagnose MRSA infection nor to guide or monitor treatment for MRSA infections. Performed at [facility], [address] [place], [place] [address]      Coagulation Studies: No results for input(s): LABPROT, INR in the last 72 hours.  Urinalysis: Recent Labs    07/11/18 1541  COLORURINE STRAW*  LABSPEC 1.008  PHURINE 5.0  GLUCOSEU NEGATIVE  HGBUR MODERATE*  BILIRUBINUR NEGATIVE  KETONESUR NEGATIVE  PROTEINUR NEGATIVE  NITRITE NEGATIVE  LEUKOCYTESUR NEGATIVE        Imaging: Dg Chest 1 View  Result Date: 07/12/2018 CLINICAL DATA:  Urinary  retention, renal failure and leukocytosis. EXAM: CHEST  1 VIEW COMPARISON:  None. FINDINGS: The heart size and mediastinal contours are within normal limits. There is no evidence of pulmonary edema, consolidation, pneumothorax, nodule or pleural fluid. The visualized skeletal structures are unremarkable. IMPRESSION: No active disease. Electronically Signed   By: Irish Lack M.D.   On: 07/12/2018 07:45   US Renal  Result Date: 07/11/2018 CLINICAL DATA:  Acute kidney injury EXAM: RENAL / URINARY TRACT ULTRASOUND COMPLETE COMPARISON:  None. FINDINGS: Right Kidney: Renal measurements: 9.6 x 4.8 x 4.5 cm = volume: 108.7 mL . Echogenicity and renal cortical thickness are within normal limits. No mass or perinephric fluid visualized. There is slight fullness of the right renal collecting system. No sonographically demonstrable calculus or ureterectasis. Left Kidney: Renal measurements: 10.3 x 5.0 x 5.3 cm = volume: 143.5 mL. Echogenicity and renal cortical thickness are within normal limits. No mass or perinephric fluid visualized. There is fullness of the left renal collecting system, slightly more than on the right side. No sonographically demonstrable calculus or ureterectasis. Bladder: Urinary bladder is decompressed. There appears to be thickening of the wall of the urinary bladder. IMPRESSION: 1. Mild hydronephrosis on the left. Rather minimal hydronephrosis on the right. No obstructing focus evident on either side. The renal cortical thickness and renal echogenicity are normal bilaterally. 2. Urinary bladder is largely decompressed. There appears to be a degree of  wall thickening in the bladder which may indicate chronic cystitis. Electronically Signed   By: Bretta BangWilliam  Woodruff III M.D.   On: 07/11/2018 16:36      Assessment & Plan: Pt is a 63 y.o.   male with medical problems of Parkinsons, Schizophrenia, HTN, insulin-dependent diabetic who was admitted to [facility] on 07/11/2018 for evaluation of urinary  retention.   1.  Acute kidney injury secondary to acute urinary retention Serum creatinine has significantly improved after Foley catheter was placed Agree with keeping the Foley for now and following up with urologist for further evaluation Monitor electrolytes and renal function closely.  Expected to improve further No baseline available   LOS: 1 Andrew Hutchinson 4/24/202010:55 AM  [facility] [place], [place] [phone]  Note: This note was prepared with Dragon dictation. Any transcription errors are unintentional

## 2018-07-13 LAB — RENAL FUNCTION PANEL
Albumin: 2.9 g/dL — ABNORMAL LOW (ref 3.5–5.0)
Anion gap: 7 (ref 5–15)
BUN: 39 mg/dL — ABNORMAL HIGH (ref 8–23)
CO2: 27 mmol/L (ref 22–32)
Calcium: 8.4 mg/dL — ABNORMAL LOW (ref 8.9–10.3)
Chloride: 111 mmol/L (ref 98–111)
Creatinine, Ser: 2.12 mg/dL — ABNORMAL HIGH (ref 0.61–1.24)
GFR calc Af Amer: 37 mL/min — ABNORMAL LOW (ref >60)
GFR calc non Af Amer: 32 mL/min — ABNORMAL LOW (ref >60)
Glucose, Bld: 95 mg/dL (ref 70–99)
Phosphorus: 2.9 mg/dL (ref 2.5–4.6)
Potassium: 3.9 mmol/L (ref 3.5–5.1)
Sodium: 145 mmol/L (ref 135–145)

## 2018-07-13 LAB — HIV ANTIBODY (ROUTINE TESTING W REFLEX): HIV Screen 4th Generation wRfx: NONREACTIVE

## 2018-07-13 LAB — GLUCOSE, CAPILLARY
Glucose-Capillary: 106 mg/dL — ABNORMAL HIGH (ref 70–99)
Glucose-Capillary: 157 mg/dL — ABNORMAL HIGH (ref 70–99)
Glucose-Capillary: 93 mg/dL (ref 70–99)
Glucose-Capillary: 145 mg/dL — ABNORMAL HIGH (ref 70–99)

## 2018-07-13 MED ORDER — HYDROCODONE-ACETAMINOPHEN 5-325 MG PO TABS
1.0000 | ORAL_TABLET | Freq: Four times a day (QID) | ORAL | Status: DC | PRN
  Administered 2018-07-13 – 2018-07-15 (×3): 1 via ORAL
  Filled 2018-07-13 (×3): qty 1

## 2018-07-13 NOTE — Progress Notes (Signed)
[employer] [facility] [place] [facility]                                                                                                                                                                                  Patient Demographics   Andrew Hutchinson, is a 63 y.o. male, DOB - [birth year], ZOX:[id number]  Admit date - 07/11/2018   Admitting Physician Milagros Loll, MD  Outpatient Primary MD for the patient is System, Pcp Not In   LOS - 2  Subjective:  Patient complains of right leg pain, denies any other complaints.   Review of Systems:   CONSTITUTIONAL: No documented fever. No fatigue, weakness. No weight gain, no weight loss.  EYES: No blurry or double vision.  ENT: No tinnitus. No postnasal drip. No redness of the oropharynx.  RESPIRATORY: No cough, no wheeze, no hemoptysis. No dyspnea.  CARDIOVASCULAR: No chest pain. No orthopnea. No palpitations. No syncope.  GASTROINTESTINAL: No nausea, no vomiting or diarrhea. No abdominal pain. No melena or hematochezia.  GENITOURINARY: No dysuria or hematuria.  ENDOCRINE: No polyuria or nocturia. No heat or cold intolerance.  HEMATOLOGY: No anemia. No bruising. No bleeding.  INTEGUMENTARY: No rashes. No lesions.  MUSCULOSKELETAL: No arthritis. No swelling. No gout.  NEUROLOGIC: No numbness, tingling, or ataxia. No seizure-type activity.  PSYCHIATRIC: No anxiety. No insomnia. No ADD.    Vitals:   Vitals:   07/12/18 1139 07/12/18 1417 07/12/18 1948 07/13/18 0523  BP: 129/70 (!) 130/58 (!) 141/71 127/64  Pulse: 99  (!) 109 96  Resp: Temp: 99.2 F (37.3 C)  99.5 F (37.5 C) 98.6 F (37 C)  TempSrc: Oral  Oral Oral  SpO2: 99%  98% 95%  Weight:      Height:        Wt Readings from Last 3 Encounters:  07/11/18 108 kg     Intake/Output Summary (Last 24 hours) at 07/13/2018 1144 Last data filed at 07/13/2018 0900 Gross per 24 hour  Intake 360 ml  Output 2400 ml  Net -2040 ml    Physical Exam:    GENERAL: Pleasant-appearing in no apparent distress.  HEAD, EYES, EARS, NOSE AND THROAT: Atraumatic, normocephalic. Extraocular muscles are intact. Pupils equal and reactive to light. Sclerae anicteric. No conjunctival injection. No oro-pharyngeal erythema.  NECK: Supple. There is no jugular venous distention. No bruits, no lymphadenopathy, no thyromegaly.  HEART: Regular rate and rhythm,. No murmurs, no rubs, no clicks.  LUNGS: Clear to auscultation bilaterally. No rales or rhonchi. No wheezes.  ABDOMEN: Soft, flat, nontender, nondistended. Has good bowel sounds. No hepatosplenomegaly appreciated.  EXTREMITIES: No evidence of any cyanosis, clubbing, or  1+ peripheral edema.  +2 pedal and radial pulses bilaterally.  NEUROLOGIC: The patient is alert, awake, and oriented x3 with no focal motor or sensory deficits appreciated bilaterally.  SKIN: Moist and warm with no rashes appreciated.  Psych: Not anxious, depressed LN: No inguinal LN enlargement    Antibiotics   Anti-infectives (From admission, onward)   None      Medications   Scheduled Meds: . amLODipine  10 mg Oral Daily  . heparin  5,000 Units Subcutaneous Q8H  . hydrALAZINE  50 mg Oral Q8H  . insulin aspart  0-9 Units Subcutaneous TID WC  . insulin detemir  14 Units Subcutaneous BID AC & HS  . OLANZapine  15 mg Oral QHS  . tamsulosin  0.4 mg Oral QPC supper   Continuous Infusions: . sodium chloride Stopped (07/12/18 1210)   PRN Meds:.acetaminophen **OR** acetaminophen, albuterol, hydrALAZINE, ondansetron **OR** ondansetron (ZOFRAN) IV, polyethylene glycol   Data Review:   Micro Results Recent Results (from the past 240 hour(s))  MRSA PCR Screening     Status: None   Collection Time: 07/11/18  6:41 PM  Result Value Ref Range Status   MRSA by PCR NEGATIVE NEGATIVE Final    Comment:        The GeneXpert MRSA Assay (FDA approved for NASAL specimens only), is one component of a comprehensive MRSA  colonization surveillance program. It is not intended to diagnose MRSA infection nor to guide or monitor treatment for MRSA infections. Performed at [facility], [address] [place], [place] [address]     Radiology Reports Dg Chest 1 View  Result Date: 07/12/2018 CLINICAL DATA:  Urinary retention, renal failure and leukocytosis. EXAM: CHEST  1 VIEW COMPARISON:  None. FINDINGS: The heart size and mediastinal contours are within normal limits. There is no evidence of pulmonary edema, consolidation, pneumothorax, nodule or pleural fluid. The visualized skeletal structures are unremarkable. IMPRESSION: No active disease. Electronically Signed   By: Irish LackGlenn  Yamagata M.D.   On: 07/12/2018 07:45   [place] Renal  Result Date: 07/11/2018 CLINICAL DATA:  Acute kidney injury EXAM: RENAL / URINARY TRACT ULTRASOUND COMPLETE COMPARISON:  None. FINDINGS: Right Kidney: Renal measurements: 9.6 x 4.8 x 4.5 cm = volume: 108.7 mL . Echogenicity and renal cortical thickness are within normal limits. No mass or perinephric fluid visualized. There is slight fullness of the right renal collecting system. No sonographically demonstrable calculus or ureterectasis. Left Kidney: Renal measurements: 10.3 x 5.0 x 5.3 cm = volume: 143.5 mL. Echogenicity and renal cortical thickness are within normal limits. No mass or perinephric fluid visualized. There is fullness of the left renal collecting system, slightly more than on the right side. No sonographically demonstrable calculus or ureterectasis. Bladder: Urinary bladder is decompressed. There appears to be thickening of the wall of the urinary bladder. IMPRESSION: 1. Mild hydronephrosis on the left. Rather minimal hydronephrosis on the right. No obstructing focus evident on either side. The renal cortical thickness and renal echogenicity are normal bilaterally. 2. Urinary bladder is largely decompressed. There appears to be a degree of wall thickening in the bladder  which may indicate chronic cystitis. Electronically Signed   By: Bretta BangWilliam  Woodruff III M.D.   On: 07/11/2018 16:36     CBC Recent Labs  Lab 07/11/18 1542 07/12/18 0508  WBC 13.2* 12.8*  HGB 11.3* 10.3*  HCT 36.5* 33.2*  PLT 429* 412*  MCV 85.3 85.3  MCH 26.4 26.5  MCHC 31.0 31.0  RDW 15.7* 15.4  LYMPHSABS  2.3  --   MONOABS 1.3*  --   EOSABS 0.1  --   BASOSABS 0.0  --     Chemistries  Recent Labs  Lab 07/11/18 1542 07/12/18 0508 07/13/18 0429  NA 145 145 145  K 4.5 3.9 3.9  CL 109 112* 111  CO2 27 26 27   GLUCOSE 88 108* 95  BUN 80* 59* 39*  CREATININE 5.27* 2.91* 2.12*  CALCIUM 8.5* 8.1* 8.4*  AST 14*  --   --   ALT 16  --   --   ALKPHOS 66  --   --   BILITOT 0.5  --   --    ------------------------------------------------------------------------------------------------------------------ estimated creatinine clearance is 43.2 mL/min (A) (by C-G formula based on SCr of 2.12 mg/dL (H)). ------------------------------------------------------------------------------------------------------------------ Recent Labs    07/11/18 1541  HGBA1C 6.4*   ------------------------------------------------------------------------------------------------------------------ No results for input(s): CHOL, HDL, LDLCALC, TRIG, CHOLHDL, LDLDIRECT in the last 72 hours. ------------------------------------------------------------------------------------------------------------------ No results for input(s): TSH, T4TOTAL, T3FREE, THYROIDAB in the last 72 hours.  Invalid input(s): FREET3 ------------------------------------------------------------------------------------------------------------------ No results for input(s): VITAMINB12, FOLATE, FERRITIN, TIBC, IRON, RETICCTPCT in the last 72 hours.  Coagulation profile No results for input(s): INR, PROTIME in the last 168 hours.  No results for input(s): DDIMER in the last 72 hours.  Cardiac Enzymes No results for input(s): CKMB,  TROPONINI, MYOGLOBIN in the last 168 hours.  Invalid input(s): CK ------------------------------------------------------------------------------------------------------------------ Invalid input(s): POCBNP    Assessment & Plan   *Acute kidney injury over likely chronic kidney disease of diabetes mellitus.   I improving with IV fluids creatinine is down from 5.27-2.12.  Acute kidney injury is secondary to urine retention, urine output is improved with Foley catheter, kidney injury also is improved, possible [facility] with Foley catheter, Flomax.  *Hypertension.  Continue home medications of amlodipine.  Hydralazine IV PRN blood pressure stable  *Bladder outlet obstruction.    Continue  Flomax.  Will need outpatient urology follow-up after discharge.  Will need Foley at discharge  *Diabetes mellitus.  Blood sugar currently stable Continue current Lantus dose  *Schizophrenia.  Continue home medications  *Leukocytosis likely reactive    Code Status Orders  (From admission, onward)         Start     Ordered   07/11/18 1658  Full code  Continuous     07/11/18 1659        Code Status History    This patient has a current code status but no historical code status.           Consults nephrology  DVT Prophylaxis   Heparin  Lab Results  Component Value Date   PLT 412 (H) 07/12/2018     Time Spent in minutes   35 minutes  Greater than 50% of time spent in care coordination and counseling patient regarding the condition and plan of care.   Katha Hamming M.D on 07/13/2018 at 11:44 AM  Between 7am to 6pm - Pager - [phone]  After 6pm go to [email or web address] - Social research officer, government  [employer]   Office  [phone]

## 2018-07-14 LAB — URINALYSIS, COMPLETE (UACMP) WITH MICROSCOPIC
Bilirubin Urine: NEGATIVE
Glucose, UA: NEGATIVE mg/dL
Ketones, ur: NEGATIVE mg/dL
Nitrite: NEGATIVE
Protein, ur: 100 mg/dL — AB
RBC / HPF: >50 RBC/hpf — ABNORMAL HIGH (ref 0–5)
Specific Gravity, Urine: 1.017 (ref 1.005–1.030)
WBC, UA: >50 WBC/hpf — ABNORMAL HIGH (ref 0–5)
pH: 6 (ref 5.0–8.0)

## 2018-07-14 LAB — BASIC METABOLIC PANEL
Anion gap: 9 (ref 5–15)
BUN: 31 mg/dL — ABNORMAL HIGH (ref 8–23)
CO2: 22 mmol/L (ref 22–32)
Calcium: 8.1 mg/dL — ABNORMAL LOW (ref 8.9–10.3)
Chloride: 108 mmol/L (ref 98–111)
Creatinine, Ser: 1.79 mg/dL — ABNORMAL HIGH (ref 0.61–1.24)
GFR calc Af Amer: 46 mL/min — ABNORMAL LOW (ref >60)
GFR calc non Af Amer: 39 mL/min — ABNORMAL LOW (ref >60)
Glucose, Bld: 120 mg/dL — ABNORMAL HIGH (ref 70–99)
Potassium: 3.9 mmol/L (ref 3.5–5.1)
Sodium: 139 mmol/L (ref 135–145)

## 2018-07-14 LAB — GLUCOSE, CAPILLARY
Glucose-Capillary: 110 mg/dL — ABNORMAL HIGH (ref 70–99)
Glucose-Capillary: 152 mg/dL — ABNORMAL HIGH (ref 70–99)
Glucose-Capillary: 148 mg/dL — ABNORMAL HIGH (ref 70–99)
Glucose-Capillary: 146 mg/dL — ABNORMAL HIGH (ref 70–99)

## 2018-07-14 MED ORDER — CIPROFLOXACIN HCL 500 MG PO TABS
500.0000 mg | ORAL_TABLET | Freq: Two times a day (BID) | ORAL | Status: DC
  Administered 2018-07-14 – 2018-07-15 (×3): 500 mg via ORAL
  Filled 2018-07-14 (×3): qty 1

## 2018-07-14 NOTE — Progress Notes (Signed)
Physical Therapy Evaluation Patient Details Name: Andrew Hutchinson MRN: [id number] DOB: July 14, 1955 Today's Date: 07/14/2018   History of Present Illness  [facility] with elevated BUN/creatinine and bladder obstruction. He is admitted for AKI over likely CKD secondary to DM and bladder outlet obstruction. PMH includes HTN, DM, schizophrenia, and Parkinson's Disease.  Clinical Impression  Pt is independent with bed mobility and transfers. No assistance required when going from sitting to supine in bed at end of session. Good safety and sequencing when transferring from sit to stand. Good stability in standing. Pt is able to ambulate a full lap around RN station without an assistive device. No external support required by therapist. Pt has some high level balance deficits in single leg stance but he is at his baseline and has no further PT needs currently.     Follow Up Recommendations No PT follow up    Equipment Recommendations  None recommended by PT    Recommendations for Other Services       Precautions / Restrictions Precautions Precautions: None Restrictions Weight Bearing Restrictions: No      Mobility  Bed Mobility Overal bed mobility: Independent             General bed mobility comments: No assistance required when going from sitting to supine in bed at end of session  Transfers Overall transfer level: Independent Equipment used: None Transfers: Sit to/from Stand Sit to Stand: Independent         General transfer comment: Good safety and sequencing when transferring from sit to stand. Good stability in standing  Ambulation/Gait Ambulation/Gait assistance: Supervision Gait Distance (Feet): 200 Feet Assistive device: None       General Gait Details: Pt is able to ambulate a full lap around RN station without an assistive device. No external support required by therapist  Stairs            Wheelchair Mobility    Modified Rankin (Stroke  Patients Only)       Balance Overall balance assessment: Needs assistance Sitting-balance support: No upper extremity supported Sitting balance-Leahy Scale: Good     Standing balance support: No upper extremity supported Standing balance-Leahy Scale: Fair Standing balance comment: Negative Rhomberg. Single leg balance less than 2 seconds                             Pertinent Vitals/Pain Pain Assessment: 0-10 Pain Score: 7  Pain Location: L hip Pain Descriptors / Indicators: Aching Pain Intervention(s): Monitored during session    Home Living Family/patient expects to be discharged to:: [facility]                      Prior Function Level of Independence: Independent         Comments: Pt is independent with ADLs/IADLs. Ambulates without assistive device. No falls     Hand Dominance   Dominant Hand: Right    Extremity/Trunk Assessment   Upper Extremity Assessment Upper Extremity Assessment: Overall WFL for tasks assessed    Lower Extremity Assessment Lower Extremity Assessment: Overall WFL for tasks assessed       Communication   Communication: No difficulties  Cognition Arousal/Alertness: Awake/alert Behavior During Therapy: WFL for tasks assessed/performed Overall Cognitive Status: Within Functional Limits for tasks assessed  General Comments      Exercises     Assessment/Plan    PT Assessment Patent does not need any further PT services  PT Problem List         PT Treatment Interventions      PT Goals (Current goals can be found in the Care Plan section)  Acute Rehab PT Goals PT Goal Formulation: All assessment and education complete, DC therapy    Frequency     Barriers to discharge        Co-evaluation               AM-PAC PT "6 Clicks" Mobility  Outcome Measure Help needed turning from your back to your side while in a flat bed without using  bedrails?: None Help needed moving from lying on your back to sitting on the side of a flat bed without using bedrails?: None Help needed moving to and from a bed to a chair (including a wheelchair)?: None Help needed standing up from a chair using your arms (e.g., wheelchair or bedside chair)?: None Help needed to walk in hospital room?: None Help needed climbing 3-5 steps with a railing? : A Little 6 Click Score: 23    End of Session Equipment Utilized During Treatment: Gait belt Activity Tolerance: Patient tolerated treatment well Patient left: in bed;with call bell/phone within reach;with bed alarm set   PT Visit Diagnosis: Unsteadiness on feet (R26.81)    Time: [id number] PT Time Calculation (min) (ACUTE ONLY): 19 min   Charges:   PT Evaluation $PT Eval Low Complexity: 1 Low          Mariel Lukins D Chalyn Amescua PT, DPT, GCS   Camree Wigington 07/14/2018, 3:40 PM

## 2018-07-14 NOTE — Progress Notes (Signed)
[facility]                                                                                                                                                                                  Patient Demographics   Andrew Hutchinson, is a 63 y.o. male, DOB - 06-27-1955, [id number]  Admit date - 07/11/2018   Admitting Physician Milagros LollSrikar Sudini, MD  Outpatient Primary MD for the patient is System, Pcp Not In   LOS - 3  Subjective:  lowGrade temperature, found to have urine infection   Review of Systems:   CONSTITUTIONAL: No documented fever. No fatigue, weakness. No weight gain, no weight loss.  EYES: No blurry or double vision.  ENT: No tinnitus. No postnasal drip. No redness of the oropharynx.  RESPIRATORY: No cough, no wheeze, no hemoptysis. No dyspnea.  CARDIOVASCULAR: No chest pain. No orthopnea. No palpitations. No syncope.  GASTROINTESTINAL: No nausea, no vomiting or diarrhea. No abdominal pain. No melena or hematochezia.  GENITOURINARY: No dysuria or hematuria.  ENDOCRINE: No polyuria or nocturia. No heat or cold intolerance.  HEMATOLOGY: No anemia. No bruising. No bleeding.  INTEGUMENTARY: No rashes. No lesions.  MUSCULOSKELETAL: No arthritis. No swelling. No gout.  NEUROLOGIC: No numbness, tingling, or ataxia. No seizure-type activity.  PSYCHIATRIC: No anxiety. No insomnia. No ADD.    Vitals:   Vitals:   07/13/18 1255 07/13/18 2129 07/14/18 0541 07/14/18 1138  BP: 119/69 136/66 131/63 133/63  Pulse: (!) 110 (!) 114 (!) 112 (!) 120  Resp:  20 20 17   Temp: 98.6 F (37 C) 99.2 F (37.3 C) 99.2 F (37.3 C) 99.5 F (37.5 C)  TempSrc: Oral Oral Oral Oral  SpO2: 96% 96% 98% 97%  Weight:      Height:        Wt Readings from Last 3 Encounters:  07/11/18 108 kg     Intake/Output Summary (Last 24 hours) at 07/14/2018 1234 Last data filed at 07/14/2018 0551 Gross per 24 hour  Intake 120 ml  Output 800 ml  Net -680 ml    Physical  Exam:   GENERAL: Pleasant-appearing in no apparent distress.  HEAD, EYES, EARS, NOSE AND THROAT: Atraumatic, normocephalic. Extraocular muscles are intact. Pupils equal and reactive to light. Sclerae anicteric. No conjunctival injection. No oro-pharyngeal erythema.  NECK: Supple. There is no jugular venous distention. No bruits, no lymphadenopathy, no thyromegaly.  HEART: Regular rate and rhythm,. No murmurs, no rubs, no clicks.  LUNGS: Clear to auscultation bilaterally. No rales or rhonchi. No wheezes.  ABDOMEN: Soft, flat, nontender, nondistended. Has good bowel sounds. No hepatosplenomegaly appreciated.  EXTREMITIES: No evidence of any cyanosis, clubbing,  or 1+ peripheral edema.  +2 pedal and radial pulses bilaterally.  NEUROLOGIC: The patient is alert, awake, and oriented x3 with no focal motor or sensory deficits appreciated bilaterally.  SKIN: Moist and warm with no rashes appreciated.  Psych: Not anxious, depressed LN: No inguinal LN enlargement    Antibiotics   Anti-infectives (From admission, onward)   Start     Dose/Rate Route Frequency Ordered Stop   07/14/18 1000  ciprofloxacin (CIPRO) tablet 500 mg     500 mg Oral 2 times daily 07/14/18 0845        Medications   Scheduled Meds: . amLODipine  10 mg Oral Daily  . ciprofloxacin  500 mg Oral BID  . heparin  5,000 Units Subcutaneous Q8H  . hydrALAZINE  50 mg Oral Q8H  . insulin aspart  0-9 Units Subcutaneous TID WC  . insulin detemir  14 Units Subcutaneous BID AC & HS  . OLANZapine  15 mg Oral QHS  . tamsulosin  0.4 mg Oral QPC supper   Continuous Infusions: . sodium chloride 50 mL/hr at 07/14/18 0906   PRN Meds:.acetaminophen **OR** acetaminophen, albuterol, hydrALAZINE, HYDROcodone-acetaminophen, ondansetron **OR** ondansetron (ZOFRAN) IV, polyethylene glycol   Data Review:   Micro Results Recent Results (from the past 240 hour(s))  MRSA PCR Screening     Status: None   Collection Time: 07/11/18  6:41 PM   Result Value Ref Range Status   MRSA by PCR NEGATIVE NEGATIVE Final    Comment:        The GeneXpert MRSA Assay (FDA approved for NASAL specimens only), is one component of a comprehensive MRSA colonization surveillance program. It is not intended to diagnose MRSA infection nor to guide or monitor treatment for MRSA infections. Performed at [facility], [address], [place], [place] [address]     Radiology Reports Dg Chest 1 View  Result Date: 07/12/2018 CLINICAL DATA:  Urinary retention, renal failure and leukocytosis. EXAM: CHEST  1 VIEW COMPARISON:  None. FINDINGS: The heart size and mediastinal contours are within normal limits. There is no evidence of pulmonary edema, consolidation, pneumothorax, nodule or pleural fluid. The visualized skeletal structures are unremarkable. IMPRESSION: No active disease. Electronically Signed   By: Irish Lack M.D.   On: 07/12/2018 07:45   US Renal  Result Date: 07/11/2018 CLINICAL DATA:  Acute kidney injury EXAM: RENAL / URINARY TRACT ULTRASOUND COMPLETE COMPARISON:  None. FINDINGS: Right Kidney: Renal measurements: 9.6 x 4.8 x 4.5 cm = volume: 108.7 mL . Echogenicity and renal cortical thickness are within normal limits. No mass or perinephric fluid visualized. There is slight fullness of the right renal collecting system. No sonographically demonstrable calculus or ureterectasis. Left Kidney: Renal measurements: 10.3 x 5.0 x 5.3 cm = volume: 143.5 mL. Echogenicity and renal cortical thickness are within normal limits. No mass or perinephric fluid visualized. There is fullness of the left renal collecting system, slightly more than on the right side. No sonographically demonstrable calculus or ureterectasis. Bladder: Urinary bladder is decompressed. There appears to be thickening of the wall of the urinary bladder. IMPRESSION: 1. Mild hydronephrosis on the left. Rather minimal hydronephrosis on the right. No obstructing focus evident  on either side. The renal cortical thickness and renal echogenicity are normal bilaterally. 2. Urinary bladder is largely decompressed. There appears to be a degree of wall thickening in the bladder which may indicate chronic cystitis. Electronically Signed   By: Bretta Bang III M.D.   On: 07/11/2018 16:36  CBC Recent Labs  Lab 07/11/18 1542 07/12/18 0508  WBC 13.2* 12.8*  HGB 11.3* 10.3*  HCT 36.5* 33.2*  PLT 429* 412*  MCV 85.3 85.3  MCH 26.4 26.5  MCHC 31.0 31.0  RDW 15.7* 15.4  LYMPHSABS 2.3  --   MONOABS 1.3*  --   EOSABS 0.1  --   BASOSABS 0.0  --     Chemistries  Recent Labs  Lab 07/11/18 1542 07/12/18 0508 07/13/18 0429 07/14/18 0803  NA 145 145 145 139  K 4.5 3.9 3.9 3.9  CL 109 112* 111 108  CO2 GLUCOSE 88 108* 95 120*  BUN 80* 59* 39* 31*  CREATININE 5.27* 2.91* 2.12* 1.79*  CALCIUM 8.5* 8.1* 8.4* 8.1*  AST 14*  --   --   --   ALT 16  --   --   --   ALKPHOS 66  --   --   --   BILITOT 0.5  --   --   --    ------------------------------------------------------------------------------------------------------------------ estimated creatinine clearance is 51.1 mL/min (A) (by C-G formula based on SCr of 1.79 mg/dL (H)). ------------------------------------------------------------------------------------------------------------------ Recent Labs    07/11/18 1541  HGBA1C 6.4*   ------------------------------------------------------------------------------------------------------------------ No results for input(s): CHOL, HDL, LDLCALC, TRIG, CHOLHDL, LDLDIRECT in the last 72 hours. ------------------------------------------------------------------------------------------------------------------ No results for input(s): TSH, T4TOTAL, T3FREE, THYROIDAB in the last 72 hours.  Invalid input(s): FREET3 ------------------------------------------------------------------------------------------------------------------ No results for input(s):  VITAMINB12, FOLATE, FERRITIN, TIBC, IRON, RETICCTPCT in the last 72 hours.  Coagulation profile No results for input(s): INR, PROTIME in the last 168 hours.  No results for input(s): DDIMER in the last 72 hours.  Cardiac Enzymes No results for input(s): CKMB, TROPONINI, MYOGLOBIN in the last 168 hours.  Invalid input(s): CK ------------------------------------------------------------------------------------------------------------------ Invalid input(s): POCBNP    Assessment & Plan   *Acute kidney injury over likely chronic kidney disease of diabetes mellitus.   I improving with IV fluids creatinine is down from 5.27-0.79.  Acute kidney injury is secondary to urine retention, urine output is improved with Foley catheter, kidney injury also is improved, continue Foley catheter, Flomax, decrease IV fluids. Acute UTI, patient had low-grade fever, abnormal UA, started on Cipro, watch for 24 hours and if he is afebrile for 24 hours [facility].  *Hypertension.  Continue home medications of amlodipine.  Hydralazine IV PRN blood pressure stable  *Bladder outlet obstruction.    Continue  Flomax.  Will need outpatient urology follow-up after discharge.  Will need Foley at discharge  *Diabetes mellitus. Controlled. Blood sugar currently stable Continue current Lantus dose  *Schizophrenia.  Continue home medications  *Leukocytosis likely reactive    Code Status Orders  (From admission, onward)         Start     Ordered   07/11/18 1658  Full code  Continuous     07/11/18 1659        Code Status History    This patient has a current code status but no historical code status.           Consults nephrology  DVT Prophylaxis   Heparin  Lab Results  Component Value Date   PLT 412 (H) 07/12/2018     Time Spent in minutes   35 minutes  Greater than 50% of time spent in care coordination and counseling patient regarding the condition and plan of  care.   Katha Hamming M.D on 07/14/2018 at 12:34 PM  Between 7am to 6pm -  Pager - [phone]  After 6pm go to [email or web address] - Proofreader  [facility]   Office  [phone]

## 2018-07-15 ENCOUNTER — Telehealth: Payer: Self-pay | Admitting: Urology

## 2018-07-15 LAB — GLUCOSE, CAPILLARY: Glucose-Capillary: 137 mg/dL — ABNORMAL HIGH (ref 70–99)

## 2018-07-15 MED ORDER — METOPROLOL TARTRATE 25 MG PO TABS
25.0000 mg | ORAL_TABLET | Freq: Two times a day (BID) | ORAL | Status: DC
  Administered 2018-07-15: 25 mg via ORAL
  Filled 2018-07-15: qty 1

## 2018-07-15 MED ORDER — OLANZAPINE 15 MG PO TABS: 15.0000 mg | ORAL_TABLET | Freq: Every day | ORAL | 0 refills | Status: AC

## 2018-07-15 MED ORDER — METOPROLOL TARTRATE 25 MG PO TABS: 25.0000 mg | ORAL_TABLET | Freq: Two times a day (BID) | ORAL | 0 refills | Status: AC

## 2018-07-15 MED ORDER — HYDRALAZINE HCL 50 MG PO TABS: 50.0000 mg | ORAL_TABLET | Freq: Three times a day (TID) | ORAL | 0 refills | Status: AC

## 2018-07-15 MED ORDER — AMLODIPINE BESYLATE 10 MG PO TABS: 10.0000 mg | ORAL_TABLET | Freq: Every day | ORAL | 0 refills | Status: DC

## 2018-07-15 MED ORDER — CIPROFLOXACIN HCL 500 MG PO TABS: 500.0000 mg | ORAL_TABLET | Freq: Two times a day (BID) | ORAL | 0 refills | Status: AC

## 2018-07-15 MED ORDER — INSULIN DETEMIR 100 UNIT/ML ~~LOC~~ SOLN: 14.0000 [IU] | Freq: Two times a day (BID) | SUBCUTANEOUS | 11 refills | Status: AC

## 2018-07-15 MED ORDER — TAMSULOSIN HCL 0.4 MG PO CAPS: 0.4000 mg | ORAL_CAPSULE | Freq: Every day | ORAL | 0 refills | Status: AC

## 2018-07-15 MED ORDER — METOPROLOL TARTRATE 25 MG PO TABS: 25.0000 mg | ORAL_TABLET | Freq: Two times a day (BID) | ORAL | 0 refills | Status: DC

## 2018-07-15 NOTE — Progress Notes (Signed)
[facility]  ROUNDING NOTE   Subjective:  Renal function does appear to be improving. Creatinine currently 1.79. Patient resting comfortably in bed.   Objective:  Vital signs in last 24 hours:  Temp:  [98.4 F (36.9 C)-99.3 F (37.4 C)] 98.4 F (36.9 C) (04/27 0455) Pulse Rate:  [94-124] 94 (04/27 1050) Resp:  [20] 20 (04/27 0455) BP: (110-130)/(54-66) 110/64 (04/27 0900) SpO2:  [96 %-97 %] 96 % (04/27 0455)  Weight change:  Filed Weights   07/11/18 1515  Weight: 108 kg    Intake/Output: I/O last 3 completed shifts: In: 1045.5 [I.V.:1045.5] Out: 2450 [Urine:2450]   Intake/Output this shift:  Total I/O In: -  Out: 350 [Urine:350]  Physical Exam: General: No acute distress  Head: Normocephalic, atraumatic. Moist oral mucosal membranes  Eyes: Anicteric  Neck: Supple, trachea midline  Lungs:  Clear to auscultation, normal effort  Heart: S1S2 no rubs  Abdomen:  Soft, nontender, bowel sounds present  Extremities: traceperipheral edema.  Neurologic: Awake, alert, following commands  Skin: No lesions       Basic Metabolic Panel: Recent Labs  Lab 07/11/18 1542 07/12/18 0508 07/13/18 0429 07/14/18 0803  NA 145 145 145 139  K 4.5 3.9 3.9 3.9  CL 109 112* 111 108  CO2 27 26 27 22   GLUCOSE 88 108* 95 120*  BUN 80* 59* 39* 31*  CREATININE 5.27* 2.91* 2.12* 1.79*  CALCIUM 8.5* 8.1* 8.4* 8.1*  PHOS  --   --  2.9  --     Liver Function Tests: Recent Labs  Lab 07/11/18 1542 07/13/18 0429  AST 14*  --   ALT 16  --   ALKPHOS 66  --   BILITOT 0.5  --   PROT 7.6  --   ALBUMIN 3.3* 2.9*   No results for input(s): LIPASE, AMYLASE in the last 168 hours. No results for input(s): AMMONIA in the last 168 hours.  CBC: Recent Labs  Lab 07/11/18 1542 07/12/18 0508  WBC 13.2* 12.8*  NEUTROABS 9.5*  --   HGB 11.3* 10.3*  HCT 36.5* 33.2*  MCV 85.3 85.3  PLT 429* 412*    Cardiac Enzymes: No results for input(s): CKTOTAL, CKMB, CKMBINDEX,  TROPONINI in the last 168 hours.  BNP: Invalid input(s): POCBNP  CBG: Recent Labs  Lab 07/14/18 0734 07/14/18 1135 07/14/18 1631 07/14/18 2126 07/15/18 0755  GLUCAP 110* 152* 148* 146* 137*    Microbiology: Results for orders placed or performed during the hospital encounter of 07/11/18  MRSA PCR Screening     Status: None   Collection Time: 07/11/18  6:41 PM  Result Value Ref Range Status   MRSA by PCR NEGATIVE NEGATIVE Final    Comment:        The GeneXpert MRSA Assay (FDA approved for NASAL specimens only), is one component of a comprehensive MRSA colonization surveillance program. It is not intended to diagnose MRSA infection nor to guide or monitor treatment for MRSA infections. Performed at [facility], [address] [place], [place] [address]     Coagulation Studies: No results for input(s): LABPROT, INR in the last 72 hours.  Urinalysis: Recent Labs    07/14/18 0820  COLORURINE YELLOW*  LABSPEC 1.017  PHURINE 6.0  GLUCOSEU NEGATIVE  HGBUR MODERATE*  BILIRUBINUR NEGATIVE  KETONESUR NEGATIVE  PROTEINUR 100*  NITRITE NEGATIVE  LEUKOCYTESUR LARGE*      Imaging: No results found.   Medications:    . ciprofloxacin  500 mg Oral BID  .  heparin  5,000 Units Subcutaneous Q8H  . hydrALAZINE  50 mg Oral Q8H  . insulin aspart  0-9 Units Subcutaneous TID WC  . insulin detemir  14 Units Subcutaneous BID AC & HS  . metoprolol tartrate  25 mg Oral BID  . OLANZapine  15 mg Oral QHS  . tamsulosin  0.4 mg Oral QPC supper   acetaminophen **OR** acetaminophen, albuterol, hydrALAZINE, HYDROcodone-acetaminophen, ondansetron **OR** ondansetron (ZOFRAN) IV, polyethylene glycol  Assessment/ Plan:  63 y.o. male with medical problems of Parkinsons, Schizophrenia, HTN, insulin-dependent diabetic who was admitted to [facility] on 07/11/2018 for evaluation of urinary retention.   1.  Acute renal failure. 2.  Acute urinary retention. 3.   Hypertension.  Plan: Urinary retention continues to improve.  Creatinine down to 1.79.  Continue Foley drainage for now.  Recommend further outpatient urology evaluation.  Otherwise continue hydralazine and metoprolol for blood pressure control.  Thanks for allowing [place] to participate.   LOS: 4 Andrew Hutchinson 4/27/202011:50 AM

## 2018-07-15 NOTE — Discharge Summary (Signed)
Andrew Hutchinson, is a 63 y.o. male  DOB 1955/04/22  MRN [id number].  Admission date:  07/11/2018  Admitting Physician  Milagros Loll, MD  Discharge Date:  07/15/2018   Primary MD  System, Pcp Not In  Recommendations for primary care physician for things to follow:   [facility] back to [facility].  Patient is to follow-up with Dr. Vanna Hutchinson from urology in 1 week regarding voiding trials, I spoke with Dr. Vanna Hutchinson, patient will be seen by her in 1 week, discharge the patient with Foley catheter.   Admission Diagnosis  Urinary retention [R33.9] Acute renal failure, unspecified acute renal failure type (HCC) [N17.9]   Discharge Diagnosis  Urinary retention [R33.9] Acute renal failure, unspecified acute renal failure type (HCC) [N17.9]   Active Problems:   AKI (acute kidney injury) (HCC)      Past Medical History:  Diagnosis Date  . Diabetes mellitus without complication (HCC)   . Hypertension   . Parkinson disease (HCC)   . Schizophrenia (HCC)     History reviewed. No pertinent surgical history.     History of present illness and  Hospital Course:     Kindly see H&P for history of present illness and admission details, please review complete Labs, Consult reports and Test reports for all details in brief  HPI  from the history and physical done on the day of admission 63 year old male patient admitted because of urine retention.  Has history of schizophrenia, diabetes mellitus, hypertension comes from local present because of urine retention.   Hospital Course  #1. acute renal failure secondary to urine retention, sent from local present for nephrology consult, patient has been doing self cath for 10 days as per records on admission, admitted for acute renal failure secondary to urine retention, Foley catheter is  inserted, patient creatinine improved from 5.27-1.79.  Patient rare urine retention improved with Foley catheter placement, also started on Flomax, patient will be discharged back to prison with Foley catheter, Flomax, patient is to see urologist in 1 week regarding voiding trials, I spoke with Dr. Vanna Hutchinson today. 2.  Acute renal failure due to urine retention, improved with IV hydration, seen by nephrologist also. 3.  UTI, patient developed a low-grade temperature, UA showed mild bacteriuria, started on Cipro patient needs to take Cipro 500 mg twice daily for 5 days. 4.  Essential hypertension, tachycardia, patient will be given metoprolol 25 mg p.o. twice daily, hydralazine 50 mg p.o. 3 times daily. 5.  Diabetes mellitus type 2, hemoglobin A1c 6.4, well controlled, started on Levemir, continue Levemir. 6.  Schizophrenia, patient is on olanzapine.   Discharge Condition: Stable   Follow UP  Follow-up Information    Andrew Scotland, MD. Go on 07/22/2018.   Specialty:  Urology Why:  at 8:30 am for voiding trial  spoke to Dr.Brandon Contact information: [address] [place] [address] [phone]             Discharge Instructions  and  Discharge Medications      Allergies as of 07/15/2018   No Known Allergies     Medication List    TAKE these medications   ciprofloxacin 500 MG tablet Commonly known as:  CIPRO Take 1 tablet (500 mg total) by mouth 2 (two) times daily.   hydrALAZINE 50 MG tablet Commonly known as:  APRESOLINE Take 1 tablet (50 mg total) by mouth every 8 (eight) hours.   insulin detemir 100 UNIT/ML injection Commonly known as:  LEVEMIR Inject 0.14 mLs (14 Units total) into the skin 2 (two) times daily at 8 am and 10 pm.   metoprolol tartrate 25 MG tablet Commonly known as:  LOPRESSOR Take 1 tablet (25 mg total) by mouth 2 (two) times daily.   OLANZapine 15 MG tablet Commonly known as:  ZYPREXA Take 1 tablet (15 mg  total) by mouth at bedtime.   tamsulosin 0.4 MG Caps capsule Commonly known as:  FLOMAX Take 1 capsule (0.4 mg total) by mouth daily after supper.         Diet and Activity recommendation: See Discharge Instructions above   Consults obtained ;nephrology   Major procedures and Radiology Reports - PLEASE review detailed and final reports for all details, in brief -      Dg Chest 1 View  Result Date: 07/12/2018 CLINICAL DATA:  Urinary retention, renal failure and leukocytosis. EXAM: CHEST  1 VIEW COMPARISON:  None. FINDINGS: The heart size and mediastinal contours are within normal limits. There is no evidence of pulmonary edema, consolidation, pneumothorax, nodule or pleural fluid. The visualized skeletal structures are unremarkable. IMPRESSION: No active disease. Electronically Signed   By: Irish LackGlenn  Yamagata M.D.   On: 07/12/2018 07:45   [place] Renal  Result Date: 07/11/2018 CLINICAL DATA:  Acute kidney injury EXAM: RENAL / URINARY TRACT ULTRASOUND COMPLETE COMPARISON:  None. FINDINGS: Right Kidney: Renal measurements: 9.6 x 4.8 x 4.5 cm = volume: 108.7 mL . Echogenicity and renal cortical thickness are within normal limits. No mass or perinephric fluid visualized. There is slight fullness of the right renal collecting system. No sonographically demonstrable calculus or ureterectasis. Left Kidney: Renal measurements: 10.3 x 5.0 x 5.3 cm = volume: 143.5 mL. Echogenicity and renal cortical thickness are within normal limits. No mass or perinephric fluid visualized. There is fullness of the left renal collecting system, slightly more than on the right side. No sonographically demonstrable calculus or ureterectasis. Bladder: Urinary bladder is decompressed. There appears to be thickening of the wall of the urinary bladder. IMPRESSION: 1. Mild hydronephrosis on the left. Rather minimal hydronephrosis on the right. No obstructing focus evident on either side. The renal cortical thickness and renal  echogenicity are normal bilaterally. 2. Urinary bladder is largely decompressed. There appears to be a degree of wall thickening in the bladder which may indicate chronic cystitis. Electronically Signed   By: Bretta BangWilliam  Woodruff III M.D.   On: 07/11/2018 16:36    Micro Results     Recent Results (from the past 240 hour(s))  MRSA PCR Screening     Status: None   Collection Time: 07/11/18  6:41 PM  Result Value Ref Range Status   MRSA by PCR NEGATIVE NEGATIVE Final    Comment:        The GeneXpert MRSA Assay (FDA approved for NASAL specimens only), is one component of a comprehensive MRSA colonization surveillance program. It is not intended to diagnose MRSA infection nor to guide or monitor treatment for MRSA infections. Performed at [facility], [address] [place], [place] [address]        Today   Subjective:   Malachi ParadiseJoe Houde feeling better, afebrile, stable for discharge back to prison  Objective:   Blood pressure 110/64, pulse 94, temperature 98.4 F (36.9 C), resp. rate 20, height 5\' 9"  (1.753 m), weight 108 kg, SpO2 96 %.   Intake/Output Summary (Last 24 hours) at 07/15/2018 1052 Last data filed at 07/15/2018 0500 Gross per 24 hour  Intake 1045.52 ml  Output 1650 ml  Net -604.48 ml    Exam Awake Alert, Oriented x 3, No new F.N deficits, Normal affect [place].AT,PERRAL Supple Neck,No JVD, No cervical lymphadenopathy appriciated.  Symmetrical Chest wall movement, Good air movement bilaterally, CTAB RRR,No Gallops,Rubs or new Murmurs, No Parasternal Heave +ve B.Sounds, Abd Soft, Non tender, No organomegaly appriciated, No rebound -guarding or rigidity. No Cyanosis, Clubbing or edema, No new Rash or bruise  Data Review   CBC w Diff:  Lab Results  Component Value Date   WBC 12.8 (H) 07/12/2018   HGB 10.3 (L) 07/12/2018   HCT 33.2 (L) 07/12/2018   PLT 412 (H) 07/12/2018   LYMPHOPCT 17 07/11/2018   MONOPCT 10 07/11/2018   EOSPCT 1 07/11/2018    BASOPCT 0 07/11/2018    CMP:  Lab Results  Component Value Date   NA 139 07/14/2018   K 3.9 07/14/2018   CL 108 07/14/2018   CO2 22 07/14/2018   BUN 31 (H) 07/14/2018   CREATININE 1.79 (H) 07/14/2018   PROT 7.6 07/11/2018   ALBUMIN 2.9 (L) 07/13/2018   BILITOT 0.5 07/11/2018   ALKPHOS 66 07/11/2018   AST 14 (L) 07/11/2018   ALT 16 07/11/2018  .   Total Time in preparing paper work, data evaluation and todays exam - 35 minutes  Katha Hamming M.D on 07/15/2018 at 10:52 AM    Note: This dictation was prepared with Dragon dictation along with smaller phrase technology. Any transcriptional errors that result from this process are unintentional.

## 2018-07-15 NOTE — Progress Notes (Signed)
Andrew Hutchinson to be D/C'd to [facility] per MD order.  Discussed prescriptions and follow up appointments with the patient. Prescriptions given to patient, medication list explained in detail. Pt verbalized understanding.  Allergies as of 07/15/2018   No Known Allergies     Medication List    TAKE these medications   ciprofloxacin 500 MG tablet Commonly known as:  CIPRO Take 1 tablet (500 mg total) by mouth 2 (two) times daily.   hydrALAZINE 50 MG tablet Commonly known as:  APRESOLINE Take 1 tablet (50 mg total) by mouth every 8 (eight) hours.   insulin detemir 100 UNIT/ML injection Commonly known as:  LEVEMIR Inject 0.14 mLs (14 Units total) into the skin 2 (two) times daily at 8 am and 10 pm.   metoprolol tartrate 25 MG tablet Commonly known as:  LOPRESSOR Take 1 tablet (25 mg total) by mouth 2 (two) times daily.   OLANZapine 15 MG tablet Commonly known as:  ZYPREXA Take 1 tablet (15 mg total) by mouth at bedtime.   tamsulosin 0.4 MG Caps capsule Commonly known as:  FLOMAX Take 1 capsule (0.4 mg total) by mouth daily after supper.       Vitals:   07/15/18 0900 07/15/18 1050  BP: 110/64   Pulse: (!) 118 94  Resp:    Temp:    SpO2:      Skin clean, dry and intact without evidence of skin break down, no evidence of skin tears noted. IV catheter discontinued intact. Site without signs and symptoms of complications. Dressing and pressure applied. Pt denies pain at this time. No complaints noted.  An After Visit Summary was printed and given to the patient. Patient escorted via [facility], and [facility] via Police car. Cecil Cobbs RN [facility] Phone [phone]

## 2018-07-15 NOTE — Telephone Encounter (Signed)
Pt scheduled for 07/22/2018.

## 2018-07-15 NOTE — Telephone Encounter (Signed)
Pt is an inpatient at [facility] and has a foley cath placed. He needs a voiding trial, he self caths at home. He needs follow up appt.

## 2018-07-22 ENCOUNTER — Ambulatory Visit: Payer: Self-pay | Admitting: Urology

## 2018-07-24 ENCOUNTER — Ambulatory Visit: Payer: Self-pay | Admitting: Urology

## 2018-07-29 ENCOUNTER — Ambulatory Visit: Admitting: Urology

## 2018-07-31 ENCOUNTER — Ambulatory Visit: Admitting: Urology

## 2019-10-18 IMAGING — DX CHEST  1 VIEW
1 series · 1 of 1 positions shown · non-contrast
Comparison: None.

CLINICAL DATA: Urinary retention, renal failure and leukocytosis.

EXAM:
CHEST  1 VIEW

[chest ap]
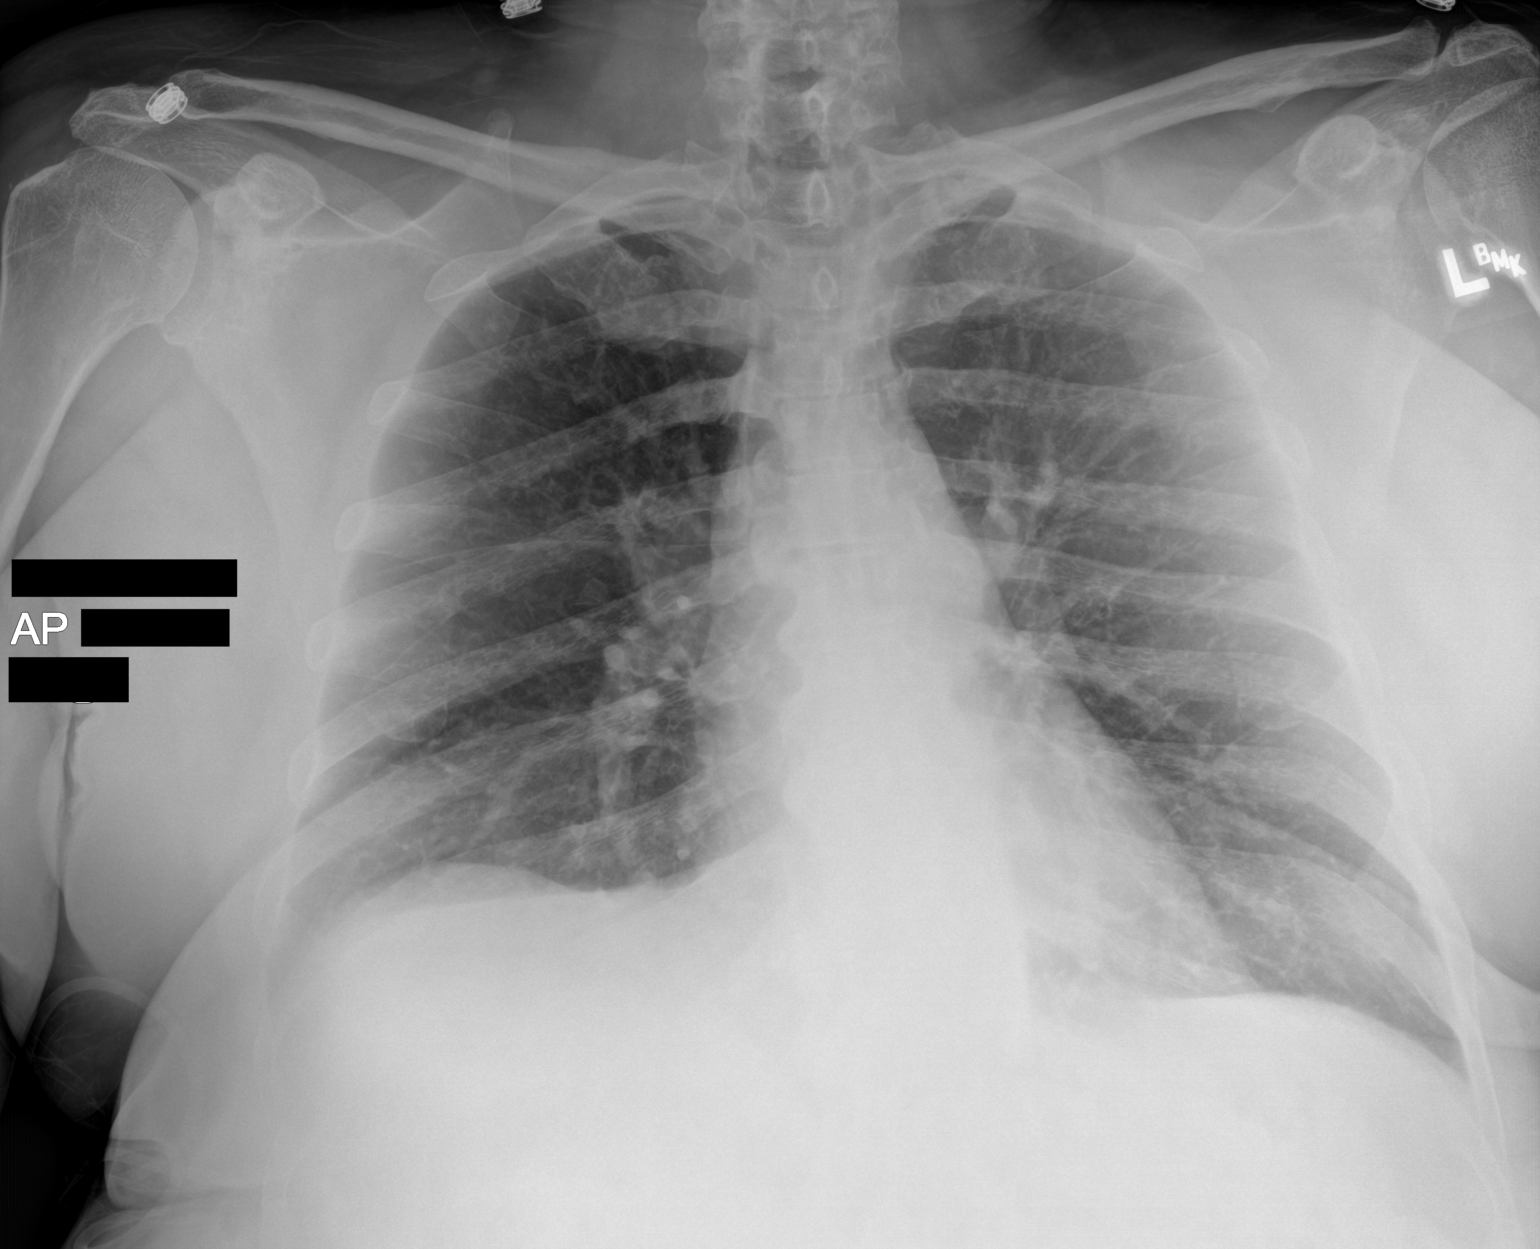

[1 of 1 positions shown; findings below may reference images not displayed]

FINDINGS: The heart size and mediastinal contours are within normal limits.
There is no evidence of pulmonary edema, consolidation,
pneumothorax, nodule or pleural fluid. The visualized skeletal
structures are unremarkable.
IMPRESSION: No active disease.
